# Patient Record
Sex: Female | Born: 1940 | Race: White | Hispanic: No | Smoking: Never smoker
Health system: Southern US, Community
[De-identification: ages and names within clinical notes are randomized; demographics above are authoritative.]

## PROBLEM LIST (undated history)

## (undated) DIAGNOSIS — E039 Hypothyroidism, unspecified: Secondary | ICD-10-CM

## (undated) DIAGNOSIS — T7840XA Allergy, unspecified, initial encounter: Secondary | ICD-10-CM

## (undated) DIAGNOSIS — N952 Postmenopausal atrophic vaginitis: Secondary | ICD-10-CM

## (undated) DIAGNOSIS — I10 Essential (primary) hypertension: Secondary | ICD-10-CM

## (undated) DIAGNOSIS — F329 Major depressive disorder, single episode, unspecified: Secondary | ICD-10-CM

## (undated) DIAGNOSIS — E079 Disorder of thyroid, unspecified: Secondary | ICD-10-CM

## (undated) DIAGNOSIS — M810 Age-related osteoporosis without current pathological fracture: Secondary | ICD-10-CM

## (undated) DIAGNOSIS — F32A Depression, unspecified: Secondary | ICD-10-CM

## (undated) DIAGNOSIS — M8580 Other specified disorders of bone density and structure, unspecified site: Secondary | ICD-10-CM

## (undated) HISTORY — DX: Other specified disorders of bone density and structure, unspecified site: M85.80

## (undated) HISTORY — DX: Disorder of thyroid, unspecified: E07.9

## (undated) HISTORY — PX: TONSILLECTOMY: SUR1361

## (undated) HISTORY — DX: Essential (primary) hypertension: I10

## (undated) HISTORY — DX: Allergy, unspecified, initial encounter: T78.40XA

## (undated) HISTORY — PX: FOOT SURGERY: SHX648

## (undated) HISTORY — DX: Major depressive disorder, single episode, unspecified: F32.9

## (undated) HISTORY — DX: Age-related osteoporosis without current pathological fracture: M81.0

## (undated) HISTORY — PX: COSMETIC SURGERY: SHX468

## (undated) HISTORY — DX: Postmenopausal atrophic vaginitis: N95.2

## (undated) HISTORY — PX: TUBAL LIGATION: SHX77

## (undated) HISTORY — DX: Depression, unspecified: F32.A

---

## 1998-01-11 ENCOUNTER — Other Ambulatory Visit: Admission: RE | Admit: 1998-01-11 | Discharge: 1998-01-11 | Payer: Self-pay | Admitting: Obstetrics and Gynecology

## 1999-04-06 ENCOUNTER — Other Ambulatory Visit: Admission: RE | Admit: 1999-04-06 | Discharge: 1999-04-06 | Payer: Self-pay | Admitting: Obstetrics and Gynecology

## 2000-06-05 ENCOUNTER — Other Ambulatory Visit: Admission: RE | Admit: 2000-06-05 | Discharge: 2000-06-05 | Payer: Self-pay | Admitting: Obstetrics and Gynecology

## 2001-06-13 ENCOUNTER — Other Ambulatory Visit: Admission: RE | Admit: 2001-06-13 | Discharge: 2001-06-13 | Payer: Self-pay | Admitting: Obstetrics and Gynecology

## 2002-07-23 ENCOUNTER — Other Ambulatory Visit: Admission: RE | Admit: 2002-07-23 | Discharge: 2002-07-23 | Payer: Self-pay | Admitting: Obstetrics and Gynecology

## 2003-08-25 ENCOUNTER — Other Ambulatory Visit: Admission: RE | Admit: 2003-08-25 | Discharge: 2003-08-25 | Payer: Self-pay | Admitting: Obstetrics and Gynecology

## 2004-11-21 ENCOUNTER — Other Ambulatory Visit: Admission: RE | Admit: 2004-11-21 | Discharge: 2004-11-21 | Payer: Self-pay | Admitting: Obstetrics and Gynecology

## 2005-08-27 ENCOUNTER — Emergency Department (HOSPITAL_COMMUNITY): Admission: EM | Admit: 2005-08-27 | Discharge: 2005-08-27 | Payer: Self-pay | Admitting: Emergency Medicine

## 2005-09-20 ENCOUNTER — Encounter: Admission: RE | Admit: 2005-09-20 | Discharge: 2005-11-06 | Payer: Self-pay | Admitting: Orthopedic Surgery

## 2005-11-07 ENCOUNTER — Encounter: Admission: RE | Admit: 2005-11-07 | Discharge: 2006-02-05 | Payer: Self-pay | Admitting: Orthopedic Surgery

## 2005-12-05 ENCOUNTER — Other Ambulatory Visit: Admission: RE | Admit: 2005-12-05 | Discharge: 2005-12-05 | Payer: Self-pay | Admitting: Obstetrics and Gynecology

## 2007-02-17 ENCOUNTER — Other Ambulatory Visit: Admission: RE | Admit: 2007-02-17 | Discharge: 2007-02-17 | Payer: Self-pay | Admitting: Obstetrics and Gynecology

## 2008-06-15 ENCOUNTER — Ambulatory Visit: Payer: Self-pay | Admitting: Obstetrics and Gynecology

## 2008-06-15 ENCOUNTER — Other Ambulatory Visit: Admission: RE | Admit: 2008-06-15 | Discharge: 2008-06-15 | Payer: Self-pay | Admitting: Obstetrics and Gynecology

## 2008-06-15 ENCOUNTER — Encounter: Payer: Self-pay | Admitting: Obstetrics and Gynecology

## 2009-08-17 ENCOUNTER — Ambulatory Visit: Payer: Self-pay | Admitting: Obstetrics and Gynecology

## 2010-11-16 ENCOUNTER — Encounter (INDEPENDENT_AMBULATORY_CARE_PROVIDER_SITE_OTHER): Payer: BC Managed Care – PPO | Admitting: Obstetrics and Gynecology

## 2010-11-16 ENCOUNTER — Other Ambulatory Visit (HOSPITAL_COMMUNITY)
Admission: RE | Admit: 2010-11-16 | Discharge: 2010-11-16 | Disposition: A | Discharge status: Home / Self Care | Payer: Medicare Other | Source: Ambulatory Visit | Attending: Obstetrics and Gynecology | Admitting: Obstetrics and Gynecology

## 2010-11-16 ENCOUNTER — Other Ambulatory Visit: Payer: Self-pay | Admitting: Obstetrics and Gynecology

## 2010-11-16 DIAGNOSIS — N951 Menopausal and female climacteric states: Secondary | ICD-10-CM

## 2010-11-16 DIAGNOSIS — N760 Acute vaginitis: Secondary | ICD-10-CM

## 2010-11-16 DIAGNOSIS — Z124 Encounter for screening for malignant neoplasm of cervix: Secondary | ICD-10-CM | POA: Insufficient documentation

## 2010-11-16 DIAGNOSIS — N952 Postmenopausal atrophic vaginitis: Secondary | ICD-10-CM

## 2010-11-16 DIAGNOSIS — M899 Disorder of bone, unspecified: Secondary | ICD-10-CM

## 2011-12-05 DIAGNOSIS — M8580 Other specified disorders of bone density and structure, unspecified site: Secondary | ICD-10-CM | POA: Insufficient documentation

## 2011-12-05 DIAGNOSIS — N952 Postmenopausal atrophic vaginitis: Secondary | ICD-10-CM | POA: Insufficient documentation

## 2011-12-05 DIAGNOSIS — I10 Essential (primary) hypertension: Secondary | ICD-10-CM | POA: Insufficient documentation

## 2011-12-13 ENCOUNTER — Encounter: Payer: Self-pay | Admitting: Obstetrics and Gynecology

## 2011-12-13 ENCOUNTER — Ambulatory Visit (INDEPENDENT_AMBULATORY_CARE_PROVIDER_SITE_OTHER): Payer: Medicare Other | Admitting: Obstetrics and Gynecology

## 2011-12-13 VITALS — BP 124/76 | Ht 65.0 in | Wt 157.0 lb

## 2011-12-13 DIAGNOSIS — E079 Disorder of thyroid, unspecified: Secondary | ICD-10-CM | POA: Insufficient documentation

## 2011-12-13 DIAGNOSIS — M8580 Other specified disorders of bone density and structure, unspecified site: Secondary | ICD-10-CM

## 2011-12-13 DIAGNOSIS — Z780 Asymptomatic menopausal state: Secondary | ICD-10-CM

## 2011-12-13 DIAGNOSIS — M899 Disorder of bone, unspecified: Secondary | ICD-10-CM

## 2011-12-13 DIAGNOSIS — M949 Disorder of cartilage, unspecified: Secondary | ICD-10-CM

## 2011-12-13 DIAGNOSIS — R350 Frequency of micturition: Secondary | ICD-10-CM

## 2011-12-13 DIAGNOSIS — N952 Postmenopausal atrophic vaginitis: Secondary | ICD-10-CM

## 2011-12-13 DIAGNOSIS — N951 Menopausal and female climacteric states: Secondary | ICD-10-CM

## 2011-12-13 MED ORDER — RALOXIFENE HCL 60 MG PO TABS: 60.0000 mg | ORAL_TABLET | Freq: Every day | ORAL | Status: AC

## 2011-12-13 NOTE — Patient Instructions (Signed)
Schedule mammogram.

## 2011-12-13 NOTE — Progress Notes (Signed)
Patient came to see me today for further followup. The first thing we discussed was her Evista. She is on it  for treatment of her osteopenia and breast cancer protection. She is having no problems with it. She is having some hot flashes but we both think that predated Evista and was due to hormonal issues. She is taking calcium and vitamin D. She has had no fractures. She is having no vaginal bleeding. She is having no pelvic pain. She did have an episode of diverticulitis this year but that has resolved. She is having urinary frequency without dysuria, urgency, hematuria, or incontinence. She continues to use a combination of estradiol/testosterone 0.2%-1% on the lips of her labia for vaginal dryness with excellent results. She uses it every third day. They also use a  Lubricant. She is going to schedule her yearly mammogram. Her last bone density showed improvement with the Evista.  ROS: 12 system review done. Pertinent positives above. Other positives are hypertension and hypothyroidism.  Physical examination: Kim gardner present HEENT within normal limits. Neck: Thyroid not large. No masses. Supraclavicular nodes: not enlarged. Breasts: Examined in both sitting midline position. No skin changes and no masses. Abdomen: Soft no guarding rebound or masses or hernia. Pelvic: External: Within normal limits. BUS: Within normal limits. Vaginal:within normal limits. Good estrogen effect. No evidence of cystocele rectocele or enterocele. Cervix: clean. Uterus: Normal size and shape. Adnexa: No masses. Rectovaginal exam: Confirmatory and negative. Extremities: Within normal limits.  Assessment: #1. Osteopenia #2. Menopausal symptoms #3. Atrophic vaginitis #4. Urinary frequency  Plan: Mammogram. Continue Evista. Continue estradiol/testosterone cream.

## 2011-12-14 LAB — URINALYSIS W MICROSCOPIC + REFLEX CULTURE
Bacteria, UA: NONE SEEN
Bilirubin Urine: NEGATIVE
Casts: NONE SEEN
Crystals: NONE SEEN
Glucose, UA: NEGATIVE mg/dL
Hgb urine dipstick: NEGATIVE
Ketones, ur: NEGATIVE mg/dL
Leukocytes, UA: NEGATIVE
Nitrite: NEGATIVE
Protein, ur: NEGATIVE mg/dL
Specific Gravity, Urine: 1.016 (ref 1.005–1.030)
Squamous Epithelial / HPF: NONE SEEN
Urobilinogen, UA: 0.2 mg/dL (ref 0.0–1.0)
pH: 6.5 (ref 5.0–8.0)

## 2012-01-24 ENCOUNTER — Ambulatory Visit (INDEPENDENT_AMBULATORY_CARE_PROVIDER_SITE_OTHER): Payer: Medicare Other | Admitting: Obstetrics and Gynecology

## 2012-01-24 DIAGNOSIS — N898 Other specified noninflammatory disorders of vagina: Secondary | ICD-10-CM

## 2012-01-24 DIAGNOSIS — B373 Candidiasis of vulva and vagina: Secondary | ICD-10-CM

## 2012-01-24 DIAGNOSIS — N899 Noninflammatory disorder of vagina, unspecified: Secondary | ICD-10-CM

## 2012-01-24 LAB — WET PREP FOR TRICH, YEAST, CLUE
Clue Cells Wet Prep HPF POC: NONE SEEN
Trich, Wet Prep: NONE SEEN

## 2012-01-24 MED ORDER — TERCONAZOLE 0.8 % VA CREA: 1.0000 | TOPICAL_CREAM | Freq: Every day | VAGINAL | Status: AC

## 2012-01-24 NOTE — Progress Notes (Signed)
A patient came to see me today with a one-week history of vulvar inflammation and redness. She is unaware of a discharge. She is not really having itching. She is having no vaginal odor.  Exam: Kennon Portela present. External: 3+ vulvitis. BUS: No lesions. Vaginal exam: Yeast like discharge. Wet prep positive for yeast.  Assessment: Yeast vaginitis  Plan: Terconazole 3 cream. Use externally and internally.

## 2012-01-30 ENCOUNTER — Telehealth: Payer: Self-pay | Admitting: *Deleted

## 2012-01-30 NOTE — Telephone Encounter (Signed)
Pt calling to follow up with ov regarding yeast infection 01/24/12, pt was give Terazol 3 day cream, pt said she is on 6 day and just starting to see improvement. Pt will refill Rx and call back if symptoms still there.

## 2012-02-21 ENCOUNTER — Telehealth: Payer: Self-pay | Admitting: *Deleted

## 2012-02-21 NOTE — Telephone Encounter (Signed)
Pt called requesting refills on her estradiol/testosterone 0.2%-1% to [facility], rx called in,spoke with stephanie, pt informed as well.

## 2012-02-26 ENCOUNTER — Encounter: Payer: Self-pay | Admitting: Obstetrics and Gynecology

## 2012-04-11 ENCOUNTER — Telehealth: Payer: Self-pay | Admitting: *Deleted

## 2012-04-11 MED ORDER — TERCONAZOLE 0.8 % VA CREA: 1.0000 | TOPICAL_CREAM | Freq: Every day | VAGINAL | Status: AC

## 2012-04-11 NOTE — Telephone Encounter (Signed)
Pt calling c/o yeast infection x 1 day vaginal irritation, pt has recurrent yeast Terazol 3 day cream sent pharmacy. Pt leaving town for 1 week, informed to make ov if no relief.

## 2012-04-15 ENCOUNTER — Encounter: Payer: Self-pay | Admitting: *Deleted

## 2012-04-15 NOTE — Progress Notes (Signed)
Patient ID: Crystal Lewis, female   DOB: [birth year], 71 y.o.   MRN: [id number] Pt called c/o yeast still there requesting more Terazol 3 day cream, pt was told Dr.G was out of office until July 31 st. Pt said he gave her 3 tube of medication, spoke with Sherrilyn Rist about this and was told to tell pt that she would need to make office visit at urgent care, pt is currently in [place]. Pt was okay with this.

## 2012-05-14 DIAGNOSIS — Z85828 Personal history of other malignant neoplasm of skin: Secondary | ICD-10-CM | POA: Insufficient documentation

## 2012-09-03 ENCOUNTER — Other Ambulatory Visit: Payer: Self-pay | Admitting: *Deleted

## 2012-09-03 NOTE — Telephone Encounter (Signed)
[facility] RF on estriol/testosterone aquaphor jar 0.2% - 1% cream refills til March 2014

## 2012-10-13 DIAGNOSIS — L821 Other seborrheic keratosis: Secondary | ICD-10-CM | POA: Insufficient documentation

## 2012-11-27 DIAGNOSIS — E039 Hypothyroidism, unspecified: Secondary | ICD-10-CM | POA: Insufficient documentation

## 2012-11-27 DIAGNOSIS — F32A Depression, unspecified: Secondary | ICD-10-CM | POA: Insufficient documentation

## 2012-11-27 DIAGNOSIS — I10 Essential (primary) hypertension: Secondary | ICD-10-CM | POA: Insufficient documentation

## 2013-02-10 DIAGNOSIS — N952 Postmenopausal atrophic vaginitis: Secondary | ICD-10-CM | POA: Insufficient documentation

## 2013-07-09 DIAGNOSIS — L219 Seborrheic dermatitis, unspecified: Secondary | ICD-10-CM | POA: Insufficient documentation

## 2013-07-09 DIAGNOSIS — L719 Rosacea, unspecified: Secondary | ICD-10-CM | POA: Insufficient documentation

## 2013-07-09 DIAGNOSIS — L814 Other melanin hyperpigmentation: Secondary | ICD-10-CM | POA: Insufficient documentation

## 2013-08-12 DIAGNOSIS — IMO0001 Reserved for inherently not codable concepts without codable children: Secondary | ICD-10-CM | POA: Insufficient documentation

## 2013-10-16 DIAGNOSIS — S22000A Wedge compression fracture of unspecified thoracic vertebra, initial encounter for closed fracture: Secondary | ICD-10-CM | POA: Insufficient documentation

## 2014-08-02 ENCOUNTER — Encounter: Payer: Self-pay | Admitting: Obstetrics and Gynecology

## 2014-12-17 DIAGNOSIS — D1801 Hemangioma of skin and subcutaneous tissue: Secondary | ICD-10-CM | POA: Insufficient documentation

## 2015-01-14 ENCOUNTER — Ambulatory Visit (INDEPENDENT_AMBULATORY_CARE_PROVIDER_SITE_OTHER): Payer: Medicare Other | Admitting: Emergency Medicine

## 2015-01-14 VITALS — BP 108/68 | HR 90 | Temp 98.6°F | Resp 17 | Ht 64.5 in | Wt 159.0 lb

## 2015-01-14 DIAGNOSIS — R69 Illness, unspecified: Principal | ICD-10-CM

## 2015-01-14 DIAGNOSIS — J111 Influenza due to unidentified influenza virus with other respiratory manifestations: Secondary | ICD-10-CM | POA: Diagnosis not present

## 2015-01-14 MED ORDER — HYDROCOD POLST-CPM POLST ER 10-8 MG/5ML PO SUER: 5.0000 mL | Freq: Two times a day (BID) | ORAL | Status: DC | PRN

## 2015-01-14 NOTE — Progress Notes (Signed)
[facility] [address] [place] [address] [phone]  Date:  01/14/2015   Name:  Crystal Lewis   DOB:  May 09, 1941   MRN:  [id number]  PCP:  Merry Lofty, MD    Chief Complaint: Cough; Dizziness; and Dehydration   History of Present Illness:  Crystal Lewis is a 74 y.o. very pleasant female patient who presents with the following:  Ill three days with fever and chills Has cough that is not productive. No wheezing or shortness of breath Daughter and grand child ill with similar No sore throat. No coryza No nausea or vomiting. No improvement with over the counter medications or other home remedies.  Denies other complaint or health concern today.    Patient Active Problem List   Diagnosis Date Noted  . Thyroid disease   . Hypertension   . Low bone mass   . Atrophic vaginitis     Past Medical History  Diagnosis Date  . Hypertension   . Low bone mass   . Atrophic vaginitis   . Thyroid disease     Hypothyroid  . Allergy   . Depression   . Osteoporosis     Past Surgical History  Procedure Laterality Date  . Foot surgery    . Tubal ligation    . Cosmetic surgery      History  Substance Use Topics  . Smoking status: Never Smoker   . Smokeless tobacco: Not on file  . Alcohol Use: Yes     Comment: rare    Family History  Problem Relation Age of Onset  . Breast cancer Mother     Age 7  . Hypertension Mother   . Cancer Mother   . Mental illness Mother   . Hypertension Son   . Breast cancer Cousin     Female 7st Mat cousin age 29  . Mental illness Maternal Grandmother   . Cancer Maternal Grandfather   . Heart disease Paternal Grandmother     Allergies  Allergen Reactions  . Sulfa Antibiotics     Medication list has been reviewed and updated.  Current Outpatient Prescriptions on File Prior to Visit  Medication Sig Dispense Refill  . B Complex Vitamins (VITAMIN B COMPLEX PO) Take by mouth.    Marland Kitchen buPROPion (WELLBUTRIN  SR) 150 MG 12 hr tablet Take 150 mg by mouth 2 (two) times daily.    . Calcium Carbonate-Vitamin D (CALCIUM-D PO) Take by mouth.    . cholecalciferol (VITAMIN D) 1000 UNITS tablet Take 1,000 Units by mouth daily.    . IODINE, KELP, PO Take by mouth.    . levothyroxine (SYNTHROID, LEVOTHROID) 100 MCG tablet Take 100 mcg by mouth daily.    . Liothyronine Sodium (CYTOMEL PO) Take by mouth.    . Multiple Vitamin (MULTIVITAMIN) capsule Take 1 capsule by mouth daily.    . NON FORMULARY Estriol/testosterone cream    . raloxifene (EVISTA) 60 MG tablet Take 1 tablet (60 mg total) by mouth daily. 90 tablet 3   No current facility-administered medications on file prior to visit.    Review of Systems:  As per HPI, otherwise negative.    Physical Examination: Filed Vitals:   01/14/15 0832  BP: 108/68  Pulse: 90  Temp: 98.6 F (37 C)  Resp: 17   Filed Vitals:   01/14/15 0832  Height: 5' 4.5" (1.638 m)  Weight: 159 lb (72.122 kg)   Body mass index is 26.88 kg/(m^2). Ideal  Body Weight: Weight in (lb) to have BMI = 25: 147.6  GEN: WDWN, NAD, Non-toxic, A & O x 3 HEENT: Atraumatic, Normocephalic. Neck supple. No masses, No LAD. Ears and Nose: No external deformity. CV: RRR, No M/G/R. No JVD. No thrill. No extra heart sounds. PULM: CTA B, no wheezes, crackles, rhonchi. No retractions. No resp. distress. No accessory muscle use. ABD: S, NT, ND, +BS. No rebound. No HSM. EXTR: No c/c/e NEURO Normal gait.  PSYCH: Normally interactive. Conversant. Not depressed or anxious appearing.  Calm demeanor.    Assessment and Plan: Influenza like illness Too late for tamiflu tussionex    Signed,  Ellison Carwin, MD

## 2015-01-14 NOTE — Patient Instructions (Signed)

## 2015-02-11 ENCOUNTER — Ambulatory Visit (INDEPENDENT_AMBULATORY_CARE_PROVIDER_SITE_OTHER): Payer: Medicare Other | Admitting: Family Medicine

## 2015-02-11 VITALS — BP 140/82 | HR 75 | Temp 98.7°F | Resp 16 | Ht 64.5 in | Wt 162.0 lb

## 2015-02-11 DIAGNOSIS — T161XXA Foreign body in right ear, initial encounter: Secondary | ICD-10-CM | POA: Diagnosis not present

## 2015-02-11 NOTE — Progress Notes (Signed)
[facility] [address], [place] [address] [phone]  Date:  02/11/2015   Name:  Crystal Lewis   DOB:  1940-10-06   MRN:  [id number]  PCP:  Merry Lofty, MD    Chief Complaint: Foreign Body in Ear   History of Present Illness:  Crystal Lewis is a 74 y.o. very pleasant female patient who presents with the following:  She has felt a FB in her right ear for a couple of weeks. Apparently she had a similar issue a few years ago and had a long hair in her ear.  Her doctor removed the hair and her sx resolved.   She does not have any pain or hearing loss, but notes a crackiling sound in her ear which reminds her exactly of what happened in the past- she feels certain that a hair is in her ear  Patient Active Problem List   Diagnosis Date Noted  . Thyroid disease   . Hypertension   . Low bone mass   . Atrophic vaginitis     Past Medical History  Diagnosis Date  . Hypertension   . Low bone mass   . Atrophic vaginitis   . Thyroid disease     Hypothyroid  . Allergy   . Depression   . Osteoporosis     Past Surgical History  Procedure Laterality Date  . Foot surgery    . Tubal ligation    . Cosmetic surgery      History  Substance Use Topics  . Smoking status: Never Smoker   . Smokeless tobacco: Not on file  . Alcohol Use: Yes     Comment: rare    Family History  Problem Relation Age of Onset  . Breast cancer Mother     Age 26  . Hypertension Mother   . Cancer Mother   . Mental illness Mother   . Hypertension Son   . Breast cancer Cousin     Female 60st Mat cousin age 78  . Mental illness Maternal Grandmother   . Cancer Maternal Grandfather   . Heart disease Paternal Grandmother     Allergies  Allergen Reactions  . Sulfa Antibiotics     Medication list has been reviewed and updated.  Current Outpatient Prescriptions on File Prior to Visit  Medication Sig Dispense Refill  . atenolol-chlorthalidone (TENORETIC) 50-25 MG per  tablet Take 1 tablet by mouth daily.    . B Complex Vitamins (VITAMIN B COMPLEX PO) Take by mouth.    Marland Kitchen buPROPion (WELLBUTRIN SR) 150 MG 12 hr tablet Take 150 mg by mouth 2 (two) times daily.    . Calcium Carbonate-Vitamin D (CALCIUM-D PO) Take by mouth.    . IODINE, KELP, PO Take by mouth.    . levothyroxine (SYNTHROID, LEVOTHROID) 100 MCG tablet Take 100 mcg by mouth daily.    . Liothyronine Sodium (CYTOMEL PO) Take by mouth.    . Multiple Vitamin (MULTIVITAMIN) capsule Take 1 capsule by mouth daily.    . NON FORMULARY Estriol/testosterone cream    . raloxifene (EVISTA) 60 MG tablet Take 1 tablet (60 mg total) by mouth daily. 90 tablet 3  . chlorpheniramine-HYDROcodone (TUSSIONEX PENNKINETIC ER) 10-8 MG/5ML SUER Take 5 mLs by mouth every 12 (twelve) hours as needed for cough. (Patient not taking: Reported on 02/11/2015) 60 mL 0   No current facility-administered medications on file prior to visit.    Review of Systems:  As per HPI- otherwise  negative.   Physical Examination: Filed Vitals:   02/11/15 1656  BP: 140/82  Pulse: 75  Temp: 98.7 F (37.1 C)  Resp: 16   Filed Vitals:   02/11/15 1656  Height: 5' 4.5" (1.638 m)  Weight: 162 lb (73.483 kg)   Body mass index is 27.39 kg/(m^2). Ideal Body Weight: Weight in (lb) to have BMI = 25: 147.6  GEN: WDWN, NAD, Non-toxic, A & O x 3 HEENT: Atraumatic, Normocephalic. Neck supple. No masses, No LAD.  TM and ear canals are WNL bilaterally.  Ears and Nose: No external deformity. CV: RRR, No M/G/R. No JVD. No thrill. No extra heart sounds. PULM: CTA B, no wheezes, crackles, rhonchi. No retractions. No resp. distress. No accessory muscle use. EXTR: No c/c/e NEURO Normal gait.  PSYCH: Normally interactive. Conversant. Not depressed or anxious appearing.  Calm demeanor.   Removed a hair from her right ear canal with forceps.  This resolved her sx Assessment and Plan: Foreign body in right ear, initial encounter  Removed hair as  above. She will follow-up if any questions  Signed Lamar Blinks, MD

## 2015-05-05 DIAGNOSIS — M79604 Pain in right leg: Secondary | ICD-10-CM | POA: Insufficient documentation

## 2015-05-05 DIAGNOSIS — L304 Erythema intertrigo: Secondary | ICD-10-CM | POA: Insufficient documentation

## 2015-05-05 DIAGNOSIS — R1031 Right lower quadrant pain: Secondary | ICD-10-CM | POA: Insufficient documentation

## 2015-09-17 ENCOUNTER — Ambulatory Visit (INDEPENDENT_AMBULATORY_CARE_PROVIDER_SITE_OTHER): Payer: Medicare Other | Admitting: Family Medicine

## 2015-09-17 VITALS — BP 122/72 | HR 104 | Temp 98.5°F | Resp 17 | Ht 64.5 in | Wt 150.0 lb

## 2015-09-17 DIAGNOSIS — R059 Cough, unspecified: Secondary | ICD-10-CM

## 2015-09-17 DIAGNOSIS — R05 Cough: Secondary | ICD-10-CM

## 2015-09-17 DIAGNOSIS — J069 Acute upper respiratory infection, unspecified: Secondary | ICD-10-CM

## 2015-09-17 MED ORDER — AZITHROMYCIN 250 MG PO TABS: ORAL_TABLET | ORAL | Status: DC

## 2015-09-17 NOTE — Patient Instructions (Signed)
Take the antihistamine that you have at home  Use the Tussionex 1 teaspoon every 12 hours as needed. Generally it is best used just for the bedtime dose.  Drink plenty of fluids and get enough rest  If you're not improving by Monday begin the azithromycin, sooner if you're getting significantly worse.  Return as needed

## 2015-09-17 NOTE — Progress Notes (Signed)
Patient ID: Crystal Lewis, female    DOB: 01-06-1941  Age: 74 y.o. MRN: [id number]  Chief Complaint  Patient presents with  . Nasal Congestion  . URI    Subjective:   Pleasant 75 year old lady who is here with an upper respiratory infection she caught from her grandkids a week ago. She had been having symptoms since about Monday. She does not smoke. She is not on a lot of regular medicines. She recently moved to a senior living facility. She is one of the youngest ones they're. She has not been running a fever. She has been having head congestion. She has sore throat and hoarseness. She has been coughing a lot at night. She had some yellow mucus yesterday and some greenish mucus after that. She felt like she should return the corner and she hasn't. She was seen by the nurse practitioner at the facility yesterday, who suggested she come in if not improving.  Current allergies, medications, problem list, past/family and social histories reviewed.  Objective:  BP 122/72 mmHg  Pulse 104  Temp(Src) 98.5 F (36.9 C) (Oral)  Resp 17  Ht 5' 4.5" (1.638 m)  Wt 150 lb (68.04 kg)  BMI 25.36 kg/m2  SpO2 98%  No major distress. Her nose is a little running. Her TMs are normal. Throat mild erythema without exudate. Neck supple without significant nodes. Chest is clear to auscultation. Heart regular without murmur. Skin is warm and dry. She did get the flu shot, senior version, earlier this fall.  Assessment & Plan:   Assessment: No diagnosis found.    Plan: Clinically a viral respiratory tract infection. I do not believe that that the symptoms are consistent with influenza or strep. Treat symptomatically. The patient already has a half half of a bottle of Tussionex, and I told her if that runs out I will call in some more for her. However did decide to give an antibiotic prescription for her to get filled if she is not turning the corner in the next couple of days.  No orders of the defined  types were placed in this encounter.    No orders of the defined types were placed in this encounter.         There are no Patient Instructions on file for this visit.   No Follow-up on file.   Crystal Kilty, MD 09/17/2015

## 2016-01-17 ENCOUNTER — Ambulatory Visit: Payer: Medicare Other | Attending: Physical Medicine and Rehabilitation

## 2016-01-17 DIAGNOSIS — R293 Abnormal posture: Secondary | ICD-10-CM | POA: Insufficient documentation

## 2016-01-17 DIAGNOSIS — M545 Low back pain, unspecified: Secondary | ICD-10-CM

## 2016-01-17 DIAGNOSIS — M546 Pain in thoracic spine: Secondary | ICD-10-CM | POA: Insufficient documentation

## 2016-01-17 DIAGNOSIS — M6281 Muscle weakness (generalized): Secondary | ICD-10-CM | POA: Diagnosis present

## 2016-01-17 NOTE — Therapy (Signed)
[facility] [address] [place], [place], [address] Phone: [phone]   Fax:  [phone]  Physical Therapy Evaluation  Patient Details  Name: Crystal Lewis MRN: [id number] Date of Birth: 05/25/1941 Referring Provider: Margorie John, MD  Encounter Date: 01/17/2016      PT End of Session - 01/17/16 1528    Visit Number 1   Number of Visits 10   Date for PT Re-Evaluation 03/13/16   PT Start Time 4827   PT Stop Time 1526   PT Time Calculation (min) 38 min   Activity Tolerance Patient tolerated treatment well   Behavior During Therapy [facility] for tasks assessed/performed      Past Medical History  Diagnosis Date  . Hypertension   . Low bone mass   . Atrophic vaginitis   . Thyroid disease     Hypothyroid  . Allergy   . Depression   . Osteoporosis     Past Surgical History  Procedure Laterality Date  . Foot surgery    . Tubal ligation    . Cosmetic surgery      There were no vitals filed for this visit.       Subjective Assessment - 01/17/16 1455    Subjective Pt presents to PT with complaints of chronic LBP.  Pt has history of osteporosis and thoracic compression fracture and degenerative scoliosis.     Pertinent History scoliosis, thoracic compression fracture (2 years ago)   Limitations Standing;Sitting   How long can you sit comfortably? if not sitting in the right chair- limited    How long can you stand comfortably? standing for housework: limited to 15-20 minutes   Diagnostic tests x-ray: degnerative scoliosis   Patient Stated Goals improve strength of spine, return to regular exercise, reduce pain   Currently in Pain? Yes   Pain Score 4   sometimes 0/10 during the day   Pain Location Back   Pain Orientation Right;Left;Lower;Mid   Pain Descriptors / Indicators Aching;Sore   Pain Type Chronic pain   Pain Onset More than a month ago   Pain Frequency Intermittent   Aggravating  Factors  night, standing for housework, sitting in the "wrong" chair   Pain Relieving Factors getting in the right position, stretching            [facility] PT Assessment - 01/17/16 0001    Assessment   Medical Diagnosis chronic back pain, scoliosis, t-spine compression fracture   Referring Provider Margorie John, MD   Onset Date/Surgical Date 01/16/14   Next MD Visit none   Prior Therapy none   Precautions   Precautions Fall;Other (comment)  osteporosis   Restrictions   Weight Bearing Restrictions No   Balance Screen   Has the patient fallen in the past 6 months No   Has the patient had a decrease in activity level because of a fear of falling?  No   Is the patient reluctant to leave their home because of a fear of falling?  No   Home Environment   Living Environment Assisted living   Living Arrangements Alone   Type of Home Assisted living   Additional Comments [facility]   Prior Function   Level of Independence Independent   Vocation Retired   Leisure Tai Chi at [facility]   Cognition   Overall Cognitive Status Within Functional Limits for tasks assessed   Observation/Other Assessments   Focus on Therapeutic Outcomes (FOTO)  45% limitation   Posture/Postural  Control   Posture/Postural Control Postural limitations   Postural Limitations Rounded Shoulders;Forward head;Increased thoracic kyphosis  thoracic scolisosi   ROM / Strength   AROM / PROM / Strength AROM;PROM;Strength   AROM   Overall AROM  Within functional limits for tasks performed   Overall AROM Comments normal AROM without pain today in spine and hips   PROM   Overall PROM  Within functional limits for tasks performed   Strength   Overall Strength Deficits   Overall Strength Comments Bil hip flexors 4/5, knees 4+/5   Palpation   Spinal mobility reduced spinal mobility in the thoracic and lumbar spine without pain today   Palpation comment muscle tension over Rt covex curve on Rt side of thoracic  spine   Ambulation/Gait   Ambulation/Gait Yes   Ambulation/Gait Assistance 7: Independent   Ambulation Distance (Feet) 100 Feet   Gait Pattern Within Functional Limits                           PT Education - 01/17/16 1526    Education provided Yes   Education Details HEP: scapular theraband attached with abdominal bracing, posture education   Person(s) Educated Patient   Methods Explanation;Demonstration   Comprehension Verbalized understanding;Returned demonstration          PT Short Term Goals - 01/17/16 1555    PT SHORT TERM GOAL #1   Title be independent in initial HEP   Time 4   Period Weeks   Status New   PT SHORT TERM GOAL #2   Title report a 30% reduction in thoracic pain with standing for washing dishes and doing housework   Time 4   Period Weeks   Status New   PT SHORT TERM GOAL #3   Title verbalize understanding of dos and don'ts of osteoporosis and body mechanics modifications needed to protect spine from fracture   Time 4   Period Weeks   Status New           PT Long Term Goals - 01/17/16 1458    PT LONG TERM GOAL #1   Title be independent in advanced HEP   Time 8   Period Weeks   Status New   PT LONG TERM GOAL #2   Title reduce FOTO to < or = to 38% limitation   Time 8   Period Weeks   Status New   PT LONG TERM GOAL #3   Title verbalize understanding of safe progresion of gym exercises to be performed at [facility]   Time 8   Period Weeks   Status New   PT LONG TERM GOAL #4   Title report a 60% reduction in thoracic and lumbar pain with standing for housework   Time 8   Period Weeks   Status New               Plan - 01/17/16 1507    Clinical Impression Statement Pt presents to PT with chronic history of LBP and 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  Pt has tried PT at other facilities but didn't feel that she was getting the education that she needed for strength and injury prevention.  Pt  demonstrates postural abnormality, weak hip flexors, weak postural stabilizers and reduced tolerance for standing activity due to weakness and pain in the thoracic and lumbar spine.  FOTO score is 45% limitation. Pt with poor mechanics and no education regarding her osteoporosis  precautions. Pt will benefit from skilled PT for postural education and strength, balance training and education on gym exercises that she can perform safely at [place].   Rehab Potential Good   PT Frequency 2x / week   PT Duration 8 weeks   PT Treatment/Interventions ADLs/Self Care Home Management;Cryotherapy;Electrical Stimulation;Moist Heat;Therapeutic exercise;Therapeutic activities;Ultrasound;Neuromuscular re-education;Manual techniques;Patient/family education;Passive range of motion   PT Next Visit Plan Osteoporosis eductation, strength for core and thoracic spine, posture education, gym exercises. Modalities PRN   Consulted and Agree with Plan of Care Patient      Patient will benefit from skilled therapeutic intervention in order to improve the following deficits and impairments:  Pain, Decreased strength, Improper body mechanics, Decreased activity tolerance, Decreased range of motion, Impaired flexibility  Visit Diagnosis: Bilateral low back pain without sciatica  Pain in thoracic spine  Abnormal posture  Muscle weakness (generalized)      G-Codes - 2016/01/26 1453    Functional Assessment Tool Used FOTO: 45% limitation   Functional Limitation Other PT primary   Other PT Primary Current Status (F1638) At least 40 percent but less than 60 percent impaired, limited or restricted   Other PT Primary Goal Status (G6659) At least 20 percent but less than 40 percent impaired, limited or restricted       Problem List Patient Active Problem List   Diagnosis Date Noted  . Thyroid disease   . Hypertension   . Low bone mass   . Atrophic vaginitis     Sigurd Sos, PT 01-26-2016 3:59 PM  [facility] [address], [place], [place], [address] Phone: [phone]   Fax:  [phone]  Name: Crystal Lewis MRN: [id number] Date of Birth: 02/28/1941

## 2016-01-17 NOTE — Patient Instructions (Signed)
KEEP HEAD IN NEUTRAL AND SHOULDERS DOWN AND RELAXED   Hold tubing in right hand, arm forward. Pull arm back, elbow straight. Repeat __10__ times per set. Do __2__ sets per session. Do _1-2___ sessions per day.  Copyright  VHI. All rights reserved.     With resistive band anchored in door, grasp both ends. Keeping elbows bent, pull back, squeezing shoulder blades together. Hold _3__ seconds. Repeat _2x10___ times. Do _1-2___ sessions per day.  [email or web address].[email or web address]   Posture - Standing   Good posture is important. Avoid slouching and forward head thrust. Maintain curve in low back and align ears over shoulders, hips over ankles.  Pull your belly button in toward your back bone. Posture Tips DO: - stand tall and erect - keep chin tucked in - keep head and shoulders in alignment - check posture regularly in mirror or large window - pull head back against headrest in car seat;  Change your position often.  Sit with lumbar support. DON'T: - slouch or slump while watching TV or reading - sit, stand or lie in one position  for too long;  Sitting is especially hard on the spine so if you sit at a desk/use the computer, then stand up often! Copyright  VHI. All rights reserved.  Posture - Sitting  Sit upright, head facing forward. Try using a roll to support lower back. Keep shoulders relaxed, and avoid rounded back. Keep hips level with knees. Avoid crossing legs for long periods. Copyright  VHI. All rights reserved.  Chronic neck strain can develop because of poor posture and faulty work habits  Postural strain related to slumped sitting and forward head posture is a leading cause of headaches, neck and upper back pain  General strengthening and flexibility exercises are helpful in the treatment of neck pain.  Most importantly, you should learn to correct the posture that may be contributing to chronic pain.   Change positions frequently  Change your work or home environment to improve  posture and mechanics.   [facility] [address] [place], [place] [address] Phone # [phone] Fax [phone]

## 2016-01-23 ENCOUNTER — Encounter: Payer: Medicare Other | Admitting: Physical Therapy

## 2016-01-25 ENCOUNTER — Ambulatory Visit: Payer: Medicare Other | Admitting: Physical Therapy

## 2016-01-25 ENCOUNTER — Encounter: Payer: Self-pay | Admitting: Physical Therapy

## 2016-01-25 DIAGNOSIS — M546 Pain in thoracic spine: Secondary | ICD-10-CM

## 2016-01-25 DIAGNOSIS — M6281 Muscle weakness (generalized): Secondary | ICD-10-CM

## 2016-01-25 DIAGNOSIS — M545 Low back pain, unspecified: Secondary | ICD-10-CM

## 2016-01-25 DIAGNOSIS — R293 Abnormal posture: Secondary | ICD-10-CM

## 2016-01-25 NOTE — Patient Instructions (Addendum)
Posture Tips DO: - stand tall and erect - keep chin tucked in - keep head and shoulders in alignment - check posture regularly in mirror or large window - pull head back against headrest in car seat;  Change your position often.  Sit with lumbar support. DON'T: - slouch or slump while watching TV or reading - sit, stand or lie in one position  for too long;  Sitting is especially hard on the spine so if you sit at a desk/use the computer, then stand up often!   Copyright  VHI. All rights reserved.  Posture - Standing   Good posture is important. Avoid slouching and forward head thrust. Maintain curve in low back and align ears over shoul- ders, hips over ankles.  Pull your belly button in toward your back bone.   Copyright  VHI. All rights reserved.  Posture - Sitting   Sit upright, head facing forward. Try using a roll to support lower back. Keep shoulders relaxed, and avoid rounded back. Keep hips level with knees. Avoid crossing legs for long periods.   Copyright  VHI. All rights reserved.  Sleeping on Back  Place pillow under knees. A pillow with cervical support and a roll around waist are also helpful. Copyright  VHI. All rights reserved.  Sleeping on Side Place pillow between knees. Use cervical support under neck and a roll around waist as needed. Copyright  VHI. All rights reserved.   Sleeping on Stomach   If this is the only desirable sleeping position, place pillow under lower legs, and under stomach or chest as needed.  Posture - Sitting   Sit upright, head facing forward. Try using a roll to support lower back. Keep shoulders relaxed, and avoid rounded back. Keep hips level with knees. Avoid crossing legs for long periods. Stand to Sit / Sit to Stand   To sit: Bend knees to lower self onto front edge of chair, then scoot back on seat. To stand: Reverse sequence by placing one foot forward, and scoot to front of seat. Use rocking motion to stand up.   Work  Height and Reach  Ideal work height is no more than 2 to 4 inches below elbow level when standing, and at elbow level when sitting. Reaching should be limited to arm's length, with elbows slightly bent.  Bending  Bend at hips and knees, not back. Keep feet shoulder-width apart.    Posture - Standing   Good posture is important. Avoid slouching and forward head thrust. Maintain curve in low back and align ears over shoul- ders, hips over ankles.  Alternating Positions   Alternate tasks and change positions frequently to reduce fatigue and muscle tension. Take rest breaks. Computer Work   Position work to Programmer, multimedia. Use proper work and seat height. Keep shoulders back and down, wrists straight, and elbows at right angles. Use chair that provides full back support. Add footrest and lumbar roll as needed.  Getting Into / Out of Car  Lower self onto seat, scoot back, then bring in one leg at a time. Reverse sequence to get out.  Dressing  Lie on back to pull socks or slacks over feet, or sit and bend leg while keeping back straight.    Housework - Sink  Place one foot on ledge of cabinet under sink when standing at sink for prolonged periods.   Pushing / Pulling  Pushing is preferable to pulling. Keep back in proper alignment, and use leg muscles to do the  work.  Energy manager and lift with both arms held against upper trunk. Tighten stomach muscles without holding breath. Use smooth movements to avoid jerking.  Avoid Twisting   Avoid twisting or bending back. Pivot around using foot movements, and bend at knees if needed when reaching for articles.  Carrying Luggage   Distribute weight evenly on both sides. Use a cart whenever possible. Do not twist trunk. Move body as a unit.   Lifting Principles .Maintain proper posture and head alignment. .Slide object as close as possible before lifting. .Move obstacles out of the way. .Test before lifting; ask for  help if too heavy. .Tighten stomach muscles without holding breath. .Use smooth movements; do not jerk. .Use legs to do the work, and pivot with feet. .Distribute the work load symmetrically and close to the center of trunk. .Push instead of pull whenever possible.   Ask For Help   Ask for help and delegate to others when possible. Coordinate your movements when lifting together, and maintain the low back curve.  Log Roll   Lying on back, bend left knee and place left arm across chest. Roll all in one movement to the right. Reverse to roll to the left. Always move as one unit. Housework - Sweeping  Use long-handled equipment to avoid stooping.   Housework - Wiping  Position yourself as close as possible to reach work surface. Avoid straining your back.  Laundry - Unloading Wash   To unload small items at bottom of washer, lift leg opposite to arm being used to reach.  [place] close to area to be raked. Use arm movements to do the work. Keep back straight and avoid twisting.     Cart  When reaching into cart with one arm, lift opposite leg to keep back straight.   Getting Into / Out of Bed  Lower self to lie down on one side by raising legs and lowering head at the same time. Use arms to assist moving without twisting. Bend both knees to roll onto back if desired. To sit up, start from lying on side, and use same move-ments in reverse. Housework - Vacuuming  Hold the vacuum with arm held at side. Step back and forth to move it, keeping head up. Avoid twisting.   Laundry - IT consultant so that bending and twisting can be avoided.   Laundry - Unloading Dryer  Squat down to reach into clothes dryer or use a reacher.  Gardening - Weeding / Probation officer or Kneel. Knee pads may be helpful.                                                             DO's and DON'T's   Avoid and/or Minimize positions of  forward bending ( flexion)  Side bending and rotation of the trunk  Especially when movements occur together   When your back aches:   Don't sit down   Lie down on your back with a small pillow under your head and one under your knees or as outlined by our therapist. Or, lie in the 90/90 position ( on the floor with your feet and legs on the sofa with knees and hips bent to 90 degrees)  Tying  or putting on your shoes:   Don't bend over to tie your shoes or put on socks.  Instead, bring one foot up, cross it over the opposite knee and bend forward (hinge) at the hips to so the task.  Keep your back straight.  If you cannot do this safely, then you need to use long handled assistive devices such as a shoehorn and sock puller.  Exercising:  Don't engage in ballistic types of exercise routines such as high-impact aerobics or jumping rope  Don't do exercises in the gym that bring you forward (abdominal crunches, sit-ups, touching your  toes, knee-to-chest, straight leg raising.)  Follow a regular exercise program that includes a variety of different weight-bearing activities, such as low-impact aerobics, T' ai chi or walking as your physical therapist advises  Do exercises that emphasize return to normal body alignment and strengthening of the muscles that keep your back straight, as outlined in this program or by your therapist  Household tasks:  Don't reach unnecessarily or twist your trunk when mopping, sweeping, vacuuming, raking, making beds, weeding gardens, getting objects ou of cupboards, etc.  Keep your broom, mop, vacuum, or rake close to you and mover your whole body as you move them. Walk over to the area on which you are working. Arrange kitchen, bathroom, and bedroom shelves so that frequently used items may be reached without excessive bending, twisting, and reaching.  Use a sturdy stool if necessary.  Don't bend from the waist to pick up something up  Off the floor, out  of the trunk of your car, or to brush your teeth, wash your face, etc.   Bend at the knees, keeping back straight as possible. Use a reacher if necessary.   Prevention of fracture is the so-called "BOTTOm -Line" in the management of OSTEOPOROSIS. Do not take unnecessary chances in movement. Once a compression fracture occurs, the process is very difficult to control; one fracture is frequently followed by many more.

## 2016-01-25 NOTE — Therapy (Signed)
[facility] [address] [place], [place], [address] Phone: [phone]   Fax:  [phone]  Physical Therapy Treatment  Patient Details  Name: DENZEL BACON MRN: [id number] Date of Birth: [birth year] Referring Provider: Margorie John, MD  Encounter Date: 01/25/2016      PT End of Session - 01/25/16 1139    Visit Number 2   Number of Visits 10   Date for PT Re-Evaluation 03/13/16   PT Start Time 1101   PT Stop Time 1142   PT Time Calculation (min) 41 min   Activity Tolerance Patient tolerated treatment well   Behavior During Therapy [facility] for tasks assessed/performed      Past Medical History  Diagnosis Date  . Hypertension   . Low bone mass   . Atrophic vaginitis   . Thyroid disease     Hypothyroid  . Allergy   . Depression   . Osteoporosis     Past Surgical History  Procedure Laterality Date  . Foot surgery    . Tubal ligation    . Cosmetic surgery      There were no vitals filed for this visit.      Subjective Assessment - 01/25/16 1106    Subjective The exercises really helped my shoulder.    Currently in Pain? Yes   Multiple Pain Sites No                         [facility] Adult PT Treatment/Exercise - 01/25/16 0001    Shoulder Exercises: Standing   Extension Strengthening;Both;20 reps;Theraband   Theraband Level (Shoulder Extension) Level 1 (Yellow)   Row Strengthening;Both;20 reps;Theraband   Theraband Level (Shoulder Row) Level 1 (Yellow)   Shoulder Exercises: ROM/Strengthening   UBE (Upper Arm Bike) L1 x 4 min reverse  Lumbar support                PT Education - 01/25/16 1115    Education provided Yes   Education Details Body mecahnics, posture, Osteo dos & don'ts   Person(s) Educated Patient   Methods Explanation;Demonstration;Tactile cues;Verbal cues;Handout   Comprehension Verbalized understanding;Returned demonstration          PT Short  Term Goals - 01/25/16 1136    PT SHORT TERM GOAL #1   Title be independent in initial HEP   Time 4   Period Weeks   Status Achieved   PT SHORT TERM GOAL #2   Title report a 30% reduction in thoracic pain with standing for washing dishes and doing housework   Time 4   Period Weeks   Status On-going   PT SHORT TERM GOAL #3   Title verbalize understanding of dos and don'ts of osteoporosis and body mechanics modifications needed to protect spine from fracture   Time 4   Period Weeks   Status Achieved           PT Long Term Goals - 01/17/16 1458    PT LONG TERM GOAL #1   Title be independent in advanced HEP   Time 8   Period Weeks   Status New   PT LONG TERM GOAL #2   Title reduce FOTO to < or = to 38% limitation   Time 8   Period Weeks   Status New   PT LONG TERM GOAL #3   Title verbalize understanding of safe progresion of gym exercises to be performed at [facility]   Time 8  Period Weeks   Status New   PT LONG TERM GOAL #4   Title report a 60% reduction in thoracic and lumbar pain with standing for housework   Time 8   Period Weeks   Status New               Plan - 01/25/16 1134    Clinical Impression Statement Educated pt in lumbar and Osteoporosis protective body menchanics today. Pain not a factor today.   Rehab Potential Good   PT Frequency 2x / week   PT Duration 8 weeks   PT Treatment/Interventions ADLs/Self Care Home Management;Cryotherapy;Electrical Stimulation;Moist Heat;Therapeutic exercise;Therapeutic activities;Ultrasound;Neuromuscular re-education;Manual techniques;Patient/family education;Passive range of motion   PT Next Visit Plan Core strength follow up with any questions regarding body mechincs   Consulted and Agree with Plan of Care Patient      Patient will benefit from skilled therapeutic intervention in order to improve the following deficits and impairments:  Pain, Decreased strength, Improper body mechanics, Decreased activity  tolerance, Decreased range of motion, Impaired flexibility  Visit Diagnosis: Bilateral low back pain without sciatica  Muscle weakness (generalized)  Abnormal posture  Pain in thoracic spine     Problem List Patient Active Problem List   Diagnosis Date Noted  . Thyroid disease   . Hypertension   . Low bone mass   . Atrophic vaginitis     Latoi Giraldo, PTA 01/25/2016, 11:43 AM  [facility] [address], [place], [place], [address] Phone: [phone]   Fax:  [phone]  Name: SITLALI FAZEL MRN: [id number] Date of Birth: Feb 27, 1941

## 2016-02-01 ENCOUNTER — Encounter: Payer: Medicare Other | Admitting: Physical Therapy

## 2016-02-03 ENCOUNTER — Encounter: Payer: Self-pay | Admitting: Physical Therapy

## 2016-02-03 ENCOUNTER — Ambulatory Visit: Payer: Medicare Other | Attending: Physical Medicine and Rehabilitation | Admitting: Physical Therapy

## 2016-02-03 DIAGNOSIS — M545 Low back pain, unspecified: Secondary | ICD-10-CM

## 2016-02-03 DIAGNOSIS — M546 Pain in thoracic spine: Secondary | ICD-10-CM | POA: Diagnosis present

## 2016-02-03 DIAGNOSIS — M6281 Muscle weakness (generalized): Secondary | ICD-10-CM | POA: Insufficient documentation

## 2016-02-03 DIAGNOSIS — R293 Abnormal posture: Secondary | ICD-10-CM | POA: Diagnosis present

## 2016-02-03 NOTE — Therapy (Signed)
[facility] [address] [place], [place], [address] Phone: [phone]   Fax:  [phone]  Physical Therapy Treatment  Patient Details  Name: Crystal Lewis MRN: [id number] Date of Birth: 1941-08-26 Referring Provider: Margorie John, MD  Encounter Date: 02/03/2016      PT End of Session - 02/03/16 1112    Visit Number 3   Number of Visits 10   Date for PT Re-Evaluation 03/13/16   PT Start Time 0923   PT Stop Time 1143   PT Time Calculation (min) 38 min   Activity Tolerance Patient tolerated treatment well   Behavior During Therapy [facility] for tasks assessed/performed      Past Medical History  Diagnosis Date  . Hypertension   . Low bone mass   . Atrophic vaginitis   . Thyroid disease     Hypothyroid  . Allergy   . Depression   . Osteoporosis     Past Surgical History  Procedure Laterality Date  . Foot surgery    . Tubal ligation    . Cosmetic surgery      There were no vitals filed for this visit.      Subjective Assessment - 02/03/16 1113    Subjective I've been doing Tai Chi and I think it is great.   Currently in Pain? No/denies   Multiple Pain Sites No                         [facility] Adult PT Treatment/Exercise - 02/03/16 0001    Shoulder Exercises: Supine   Other Supine Exercises Shoulder press and leg lengthener for HEP   Shoulder Exercises: Seated   Horizontal ABduction AROM;Strengthening;Both;10 reps;Theraband   Theraband Level (Shoulder Horizontal ABduction) Level 1 (Yellow)   Shoulder Exercises: Standing   Extension Strengthening;Both;20 reps;Theraband   Theraband Level (Shoulder Extension) Level 1 (Yellow)   Row Strengthening;Both;20 reps;Theraband   Theraband Level (Shoulder Row) Level 1 (Yellow)   Shoulder Exercises: ROM/Strengthening   UBE (Upper Arm Bike) L1 x 5 min reverse lumbar roll                 PT Education - 02/03/16 1132    Education provided Yes   Education Details HEP: Shoulder press & leg lengthener   Person(s) Educated Patient   Methods Explanation;Demonstration;Tactile cues;Verbal cues;Handout   Comprehension Verbalized understanding;Returned demonstration          PT Short Term Goals - 02/03/16 1118    PT SHORT TERM GOAL #2   Title report a 30% reduction in thoracic pain with standing for washing dishes and doing housework   Time 4   Period Weeks   Status On-going  Will pay attention and give me an answer then           PT Long Term Goals - 01/17/16 1458    PT LONG TERM GOAL #1   Title be independent in advanced HEP   Time 8   Period Weeks   Status New   PT LONG TERM GOAL #2   Title reduce FOTO to < or = to 38% limitation   Time 8   Period Weeks   Status New   PT LONG TERM GOAL #3   Title verbalize understanding of safe progresion of gym exercises to be performed at [facility]   Time 8   Period Weeks   Status New   PT LONG TERM GOAL #4  Title report a 60% reduction in thoracic and lumbar pain with standing for housework   Time 8   Period Weeks   Status New               Plan - 02/03/16 1113    Clinical Impression Statement Pt tends to stand with her pelvis tucked, hanging on her anterior hip ligaments. She needs to be physically corrected. She is independent in her HEP and has added Tai Chi to her routine. She thinks that she is feeling better doing housework, she says she didn't really pay attention.    Rehab Potential Good   PT Duration 8 weeks   PT Treatment/Interventions ADLs/Self Care Home Management;Cryotherapy;Electrical Stimulation;Moist Heat;Therapeutic exercise;Therapeutic activities;Ultrasound;Neuromuscular re-education;Manual techniques;Patient/family education;Passive range of motion      Patient will benefit from skilled therapeutic intervention in order to improve the following deficits and impairments:  Pain, Decreased strength, Improper body  mechanics, Decreased activity tolerance, Decreased range of motion, Impaired flexibility  Visit Diagnosis: Bilateral low back pain without sciatica  Muscle weakness (generalized)  Abnormal posture  Pain in thoracic spine     Problem List Patient Active Problem List   Diagnosis Date Noted  . Thyroid disease   . Hypertension   . Low bone mass   . Atrophic vaginitis     Crystal Lewis, PTA 02/03/2016, 11:40 AM  [facility] [address], [place], [place], [address] Phone: [phone]   Fax:  [phone]  Name: Crystal Lewis MRN: [id number] Date of Birth: 09/04/1941

## 2016-02-03 NOTE — Patient Instructions (Signed)
RE-ALIGNMENT ROUTINE EXERCISES-OSTEOPROROSIS BASIC FOR POSTURAL CORRECTION   RE-ALIGNMENT Tips BENEFITS: 1.It helps to re-align the curves of the back and improve standing posture. 2.It allows the back muscles to rest and strengthen in preparation for more activity. FREQUENCY: Daily, even after weeks, months and years of more advanced exercises. START: 1.All exercises start in the same position: lying on the back, arms resting on the supporting surface, palms up and slightly away from the body, backs of hands down, knees bent, feet flat. 2.The head, neck, arms, and legs are supported according to specific instructions of your therapist. Copyright  VHI. All rights reserved.    1. Decompression Exercise: Basic.   Takes compression off the vertebral bodies; increases tolerance for lying on the back; helps relieve back pain   Lie on back on firm surface, knees bent, feet flat, arms turned up, out to sides (~35 degrees). Head neck and arms supported as necessary. Time _5-15__ minutes. Surface: floor     2. Shoulder Press  Strengthens upper back extensors and scapular retractors.   Press both shoulders down. Hold _2-3__ seconds. Repeat _3-5__ times. Surface: floor         4. Leg Lengthener: stretches quadratus lumborum and hip flexors.  Strengthens quads and ankle dorsiflexors. Straighten out one leg, other leg bent. Inhale, exhale and pull the toes back reaching the heel away from your body. Hold 3 seconds. Do 5x. Repeat on the other leg.

## 2016-02-08 ENCOUNTER — Ambulatory Visit: Payer: Medicare Other | Admitting: Physical Therapy

## 2016-02-08 ENCOUNTER — Encounter: Payer: Self-pay | Admitting: Physical Therapy

## 2016-02-08 DIAGNOSIS — M545 Low back pain, unspecified: Secondary | ICD-10-CM

## 2016-02-08 DIAGNOSIS — R293 Abnormal posture: Secondary | ICD-10-CM

## 2016-02-08 DIAGNOSIS — M546 Pain in thoracic spine: Secondary | ICD-10-CM

## 2016-02-08 DIAGNOSIS — M6281 Muscle weakness (generalized): Secondary | ICD-10-CM

## 2016-02-08 NOTE — Therapy (Signed)
[facility] [address] [place], [place], [address] Phone: [phone]   Fax:  [phone]  Physical Therapy Treatment  Patient Details  Name: Crystal Lewis MRN: [id number] Date of Birth: 02-08-1941 Referring Provider: Margorie John, MD  Encounter Date: 02/08/2016      PT End of Session - 02/08/16 1105    Visit Number 4   Number of Visits 10   Date for PT Re-Evaluation 03/13/16   PT Start Time 1102   PT Stop Time 1141   PT Time Calculation (min) 39 min   Activity Tolerance Patient tolerated treatment well   Behavior During Therapy [facility] for tasks assessed/performed      Past Medical History  Diagnosis Date  . Hypertension   . Low bone mass   . Atrophic vaginitis   . Thyroid disease     Hypothyroid  . Allergy   . Depression   . Osteoporosis     Past Surgical History  Procedure Laterality Date  . Foot surgery    . Tubal ligation    . Cosmetic surgery      There were no vitals filed for this visit.      Subjective Assessment - 02/08/16 1105    Subjective No new complaints. Was able to walk [facility] yesterday.   Currently in Pain? Yes   Pain Score 3    Pain Location Shoulder   Pain Orientation Right   Pain Descriptors / Indicators Sore   Aggravating Factors  Overdoing it   Pain Relieving Factors Getting in right position, stretching   Multiple Pain Sites No                         [facility] Adult PT Treatment/Exercise - 02/08/16 0001    Shoulder Exercises: Seated   Horizontal ABduction Strengthening;Both;20 reps;Theraband   Theraband Level (Shoulder Horizontal ABduction) Level 2 (Red)   Horizontal ABduction Weight (lbs) --  Diagonols bil 2 x10 red   Shoulder Exercises: ROM/Strengthening   UBE (Upper Arm Bike) L1 x 6 min reverse lumbar roll    Wall Pushups 10 reps   Shoulder Exercises: Stretch   Other Shoulder Stretches Lifting the heart for thoracic extension  2x5                   PT Short Term Goals - 02/08/16 1107    PT SHORT TERM GOAL #2   Title report a 30% reduction in thoracic pain with standing for washing dishes and doing housework   Time 4   Period Weeks   Status Achieved  30%           PT Long Term Goals - 01/17/16 1458    PT LONG TERM GOAL #1   Title be independent in advanced HEP   Time 8   Period Weeks   Status New   PT LONG TERM GOAL #2   Title reduce FOTO to < or = to 38% limitation   Time 8   Period Weeks   Status New   PT LONG TERM GOAL #3   Title verbalize understanding of safe progresion of gym exercises to be performed at [facility]   Time 8   Period Weeks   Status New   PT LONG TERM GOAL #4   Title report a 60% reduction in thoracic and lumbar pain with standing for housework   Time 8   Period Weeks   Status New  Plan - 02/08/16 1128    Clinical Impression Statement Pt reports today in general doing her standing housework is 30% easier/less painful. She reports leaning over the sink to dye her hair is probably most painful. Added thoracic extension stretch seated toay to HEP and pt was bale to perform  wall pushups today with excellent form.  All STGs met. as of today.    Rehab Potential Good   PT Frequency 2x / week   PT Treatment/Interventions ADLs/Self Care Home Management;Cryotherapy;Electrical Stimulation;Moist Heat;Therapeutic exercise;Therapeutic activities;Ultrasound;Neuromuscular re-education;Manual techniques;Patient/family education;Passive range of motion   PT Next Visit Plan try lifting arms off the wall or try in prone?   Consulted and Agree with Plan of Care Patient      Patient will benefit from skilled therapeutic intervention in order to improve the following deficits and impairments:  Pain, Decreased strength, Improper body mechanics, Decreased activity tolerance, Decreased range of motion, Impaired flexibility  Visit Diagnosis: Bilateral low back  pain without sciatica  Muscle weakness (generalized)  Abnormal posture  Pain in thoracic spine     Problem List Patient Active Problem List   Diagnosis Date Noted  . Thyroid disease   . Hypertension   . Low bone mass   . Atrophic vaginitis     Lasonya Hubner, PTA 02/08/2016, 11:44 AM  [facility] [address], [place], [place], [address] Phone: [phone]   Fax:  [phone]  Name: Crystal Lewis MRN: [id number] Date of Birth: [birth year]

## 2016-02-10 ENCOUNTER — Ambulatory Visit: Payer: Medicare Other | Admitting: Physical Therapy

## 2016-02-10 ENCOUNTER — Encounter: Payer: Self-pay | Admitting: Physical Therapy

## 2016-02-10 DIAGNOSIS — M545 Low back pain, unspecified: Secondary | ICD-10-CM

## 2016-02-10 DIAGNOSIS — M546 Pain in thoracic spine: Secondary | ICD-10-CM

## 2016-02-10 DIAGNOSIS — M6281 Muscle weakness (generalized): Secondary | ICD-10-CM

## 2016-02-10 DIAGNOSIS — R293 Abnormal posture: Secondary | ICD-10-CM

## 2016-02-10 NOTE — Therapy (Signed)
[facility] [address], [place], [place], [address] Phone: [phone]   Fax:  [phone]  Physical Therapy Treatment  Patient Details  Name: Crystal Lewis MRN: [id number] Date of Birth: May 07, 1941 Referring Provider: Margorie John, MD  Encounter Date: 02/10/2016      PT End of Session - 02/10/16 0941    Visit Number 5   Number of Visits 10   Date for PT Re-Evaluation 03/13/16   PT Start Time 0937   PT Stop Time 1012   PT Time Calculation (min) 35 min   Activity Tolerance Patient tolerated treatment well   Behavior During Therapy [facility] for tasks assessed/performed      Past Medical History  Diagnosis Date  . Hypertension   . Low bone mass   . Atrophic vaginitis   . Thyroid disease     Hypothyroid  . Allergy   . Depression   . Osteoporosis     Past Surgical History  Procedure Laterality Date  . Foot surgery    . Tubal ligation    . Cosmetic surgery      There were no vitals filed for this visit.      Subjective Assessment - 02/10/16 0942    Subjective Pt reports tolerating last tx just fine, went to [facility] yesterday.   Currently in Pain? No/denies   Multiple Pain Sites No                         [facility] Adult PT Treatment/Exercise - 02/10/16 0001    Shoulder Exercises: Seated   Horizontal ABduction Strengthening;Both;Theraband  3x 10   Theraband Level (Shoulder Horizontal ABduction) Level 2 (Red)   Horizontal ABduction Weight (lbs) --  Diagonols bil 2 x10 red   Shoulder Exercises: ROM/Strengthening   UBE (Upper Arm Bike) L1 x 6 min reverse lumbar roll    Wall Pushups 20 reps   Shoulder Exercises: Stretch   Other Shoulder Stretches Lifting the heart for thoracic extension 2x5   Added lifting the arms overhead into a V                  PT Short Term Goals - 02/08/16 1107    PT SHORT TERM GOAL #2   Title report a 30% reduction in thoracic pain with  standing for washing dishes and doing housework   Time 4   Period Weeks   Status Achieved  30%           PT Long Term Goals - 01/17/16 1458    PT LONG TERM GOAL #1   Title be independent in advanced HEP   Time 8   Period Weeks   Status New   PT LONG TERM GOAL #2   Title reduce FOTO to < or = to 38% limitation   Time 8   Period Weeks   Status New   PT LONG TERM GOAL #3   Title verbalize understanding of safe progresion of gym exercises to be performed at [facility]   Time 8   Period Weeks   Status New   PT LONG TERM GOAL #4   Title report a 60% reduction in thoracic and lumbar pain with standing for housework   Time 8   Period Weeks   Status New               Plan - 02/10/16 0941    Clinical Impression Statement Continues to tolerate  all her exercises well, no pain. She is doing even better with all her housework.    Rehab Potential Good   PT Frequency 2x / week   PT Duration 8 weeks   PT Treatment/Interventions ADLs/Self Care Home Management;Cryotherapy;Electrical Stimulation;Moist Heat;Therapeutic exercise;Therapeutic activities;Ultrasound;Neuromuscular re-education;Manual techniques;Patient/family education;Passive range of motion   PT Next Visit Plan Thoracic extension ROm & strength   Consulted and Agree with Plan of Care Patient      Patient will benefit from skilled therapeutic intervention in order to improve the following deficits and impairments:  Pain, Decreased strength, Improper body mechanics, Decreased activity tolerance, Decreased range of motion, Impaired flexibility  Visit Diagnosis: Bilateral low back pain without sciatica  Muscle weakness (generalized)  Abnormal posture  Pain in thoracic spine     Problem List Patient Active Problem List   Diagnosis Date Noted  . Thyroid disease   . Hypertension   . Low bone mass   . Atrophic vaginitis     Taylar Hartsough, PTA 02/10/2016, 10:15 AM  [facility] [address], [place], [place], [address] Phone: [phone]   Fax:  [phone]  Name: Crystal Lewis MRN: [id number] Date of Birth: 07-24-1941

## 2016-02-15 ENCOUNTER — Encounter: Payer: Self-pay | Admitting: Physical Therapy

## 2016-02-15 ENCOUNTER — Ambulatory Visit: Payer: Medicare Other | Admitting: Physical Therapy

## 2016-02-15 DIAGNOSIS — M546 Pain in thoracic spine: Secondary | ICD-10-CM

## 2016-02-15 DIAGNOSIS — R293 Abnormal posture: Secondary | ICD-10-CM

## 2016-02-15 DIAGNOSIS — M6281 Muscle weakness (generalized): Secondary | ICD-10-CM

## 2016-02-15 DIAGNOSIS — M545 Low back pain, unspecified: Secondary | ICD-10-CM

## 2016-02-15 NOTE — Therapy (Signed)
[facility] [address], [place], [place], [address] Phone: [phone]   Fax:  [phone]  Physical Therapy Treatment  Patient Details  Name: Crystal Lewis MRN: [id number] Date of Birth: 28-Jan-1941 Referring Provider: Margorie John, MD  Encounter Date: 02/15/2016      PT End of Session - 02/15/16 1107    Visit Number 6   Number of Visits 10   Date for PT Re-Evaluation 03/13/16   PT Start Time 1100   PT Stop Time 1150   PT Time Calculation (min) 50 min   Activity Tolerance Patient tolerated treatment well   Behavior During Therapy [facility] for tasks assessed/performed      Past Medical History  Diagnosis Date  . Hypertension   . Low bone mass   . Atrophic vaginitis   . Thyroid disease     Hypothyroid  . Allergy   . Depression   . Osteoporosis     Past Surgical History  Procedure Laterality Date  . Foot surgery    . Tubal ligation    . Cosmetic surgery      There were no vitals filed for this visit.      Subjective Assessment - 02/15/16 1104    Subjective She has babysat  her twin grandsons and had home emergency which is still goin gon. Pt feels this stress has made her back hurt and sore.    Pain Score 5    Pain Location Back   Pain Orientation Mid;Lower   Pain Descriptors / Indicators Sore   Aggravating Factors  Stress   Pain Relieving Factors Exercise   Multiple Pain Sites No                         [facility] Adult PT Treatment/Exercise - 02/15/16 0001    Shoulder Exercises: Seated   Horizontal ABduction Strengthening;Both;Theraband  3x 10   Theraband Level (Shoulder Horizontal ABduction) Level 1 (Yellow)   Other Seated Exercises Lift the heart 10x    Other Seated Exercises Lift the arms overhead with thoracic extension 10x   Shoulder Exercises: ROM/Strengthening   UBE (Upper Arm Bike) L1 x 6 min reverse lumbar roll    Moist Heat Therapy   Number Minutes Moist Heat  20 Minutes   Moist Heat Location --  Back   Electrical Stimulation   Electrical Stimulation Location Thoracic   Electrical Stimulation Action IFC   Electrical Stimulation Goals Pain                  PT Short Term Goals - 02/08/16 1107    PT SHORT TERM GOAL #2   Title report a 30% reduction in thoracic pain with standing for washing dishes and doing housework   Time 4   Period Weeks   Status Achieved  30%           PT Long Term Goals - 01/17/16 1458    PT LONG TERM GOAL #1   Title be independent in advanced HEP   Time 8   Period Weeks   Status New   PT LONG TERM GOAL #2   Title reduce FOTO to < or = to 38% limitation   Time 8   Period Weeks   Status New   PT LONG TERM GOAL #3   Title verbalize understanding of safe progresion of gym exercises to be performed at [facility]   Time 8   Period Weeks  Status New   PT LONG TERM GOAL #4   Title report a 60% reduction in thoracic and lumbar pain with standing for housework   Time 8   Period Weeks   Status New               Plan - 02/15/16 1107    Clinical Impression Statement Pt has had rough week with just keeping her grandkids and having to travel  to [place] a few times. She was able to tolerate all her exercises but no more. Tried some Estim for her pain today.    Rehab Potential Good   PT Frequency 2x / week   PT Duration 8 weeks   PT Treatment/Interventions ADLs/Self Care Home Management;Cryotherapy;Electrical Stimulation;Moist Heat;Therapeutic exercise;Therapeutic activities;Ultrasound;Neuromuscular re-education;Manual techniques;Patient/family education;Passive range of motion   PT Next Visit Plan Thoracic extension ROm & strength   Consulted and Agree with Plan of Care Patient      Patient will benefit from skilled therapeutic intervention in order to improve the following deficits and impairments:  Pain, Decreased strength, Improper body mechanics, Decreased activity tolerance, Decreased  range of motion, Impaired flexibility  Visit Diagnosis: Bilateral low back pain without sciatica  Muscle weakness (generalized)  Abnormal posture  Pain in thoracic spine     Problem List Patient Active Problem List   Diagnosis Date Noted  . Thyroid disease   . Hypertension   . Low bone mass   . Atrophic vaginitis     COCHRAN,JENNIFER, PTA 02/15/2016, 11:37 AM  [facility] [address] [place], [place], [address] Phone: [phone]   Fax:  [phone]  Name: Crystal Lewis MRN: [id number] Date of Birth: 23-Jul-1941

## 2016-02-17 ENCOUNTER — Encounter: Payer: Medicare Other | Admitting: Physical Therapy

## 2016-02-24 ENCOUNTER — Encounter: Payer: Medicare Other | Admitting: Physical Therapy

## 2016-02-29 ENCOUNTER — Ambulatory Visit: Payer: Medicare Other | Admitting: Physical Therapy

## 2016-02-29 ENCOUNTER — Encounter: Payer: Self-pay | Admitting: Physical Therapy

## 2016-02-29 DIAGNOSIS — M545 Low back pain, unspecified: Secondary | ICD-10-CM

## 2016-02-29 DIAGNOSIS — M546 Pain in thoracic spine: Secondary | ICD-10-CM

## 2016-02-29 DIAGNOSIS — R293 Abnormal posture: Secondary | ICD-10-CM

## 2016-02-29 DIAGNOSIS — M6281 Muscle weakness (generalized): Secondary | ICD-10-CM

## 2016-02-29 NOTE — Therapy (Signed)
[facility] [address] [place], [place], [address] Phone: [phone]   Fax:  [phone]  Physical Therapy Treatment  Patient Details  Name: Crystal Lewis MRN: [id number] Date of Birth: 17-Jul-1941 Referring Provider: Margorie John, MD  Encounter Date: 02/29/2016      PT End of Session - 02/29/16 1101    Visit Number 7   Number of Visits 10   Date for PT Re-Evaluation 03/13/16   PT Start Time 7096   PT Stop Time 1140   PT Time Calculation (min) 43 min   Activity Tolerance Patient tolerated treatment well   Behavior During Therapy [facility] for tasks assessed/performed      Past Medical History  Diagnosis Date  . Hypertension   . Low bone mass   . Atrophic vaginitis   . Thyroid disease     Hypothyroid  . Allergy   . Depression   . Osteoporosis     Past Surgical History  Procedure Laterality Date  . Foot surgery    . Tubal ligation    . Cosmetic surgery      There were no vitals filed for this visit.                       [place] Adult PT Treatment/Exercise - 02/29/16 0001    Shoulder Exercises: Supine   Other Supine Exercises --  Supine TA contraction 10x added to HEP   Shoulder Exercises: Seated   Horizontal ABduction Strengthening;Both;20 reps;Theraband   Theraband Level (Shoulder Horizontal ABduction) Level 3 (Green)  Added green band for home advancement   Horizontal ABduction Weight (lbs) --  Diagonols 2x 10 bil green band   Other Seated Exercises Lift the arms overhead with thoracic extension 10x   Shoulder Exercises: Pulleys   Flexion 3 minutes   Shoulder Exercises: ROM/Strengthening   UBE (Upper Arm Bike) L1 x 6 min reverse lumbar roll    Shoulder Exercises: Stretch   Other Shoulder Stretches Leg lengthener bil 3x 10 sec each                PT Education - 02/29/16 1136    Education provided Yes   Education Details HEP TA contraction   Person(s)  Educated Patient   Methods Explanation;Demonstration;Tactile cues;Verbal cues   Comprehension Verbalized understanding;Returned demonstration          PT Short Term Goals - 02/08/16 1107    PT SHORT TERM GOAL #2   Title report a 30% reduction in thoracic pain with standing for washing dishes and doing housework   Time 4   Period Weeks   Status Achieved  30%           PT Long Term Goals - 02/29/16 1110    PT LONG TERM GOAL #4   Title report a 60% reduction in thoracic and lumbar pain with standing for housework   Time 8   Period Weeks   Status Partially Met  50%               Plan - 02/29/16 1102    Clinical Impression Statement Pt reports she feels 50% less pain with her ADLs since starting therapy.    Rehab Potential Good   PT Frequency 2x / week   PT Duration 8 weeks   PT Treatment/Interventions ADLs/Self Care Home Management;Cryotherapy;Electrical Stimulation;Moist Heat;Therapeutic exercise;Therapeutic activities;Ultrasound;Neuromuscular re-education;Manual techniques;Patient/family education;Passive range of motion   PT Next Visit Plan prepare for D/C  Consulted and Agree with Plan of Care Patient      Patient will benefit from skilled therapeutic intervention in order to improve the following deficits and impairments:  Pain, Decreased strength, Improper body mechanics, Decreased activity tolerance, Decreased range of motion, Impaired flexibility  Visit Diagnosis: Bilateral low back pain without sciatica  Muscle weakness (generalized)  Abnormal posture  Pain in thoracic spine     Problem List Patient Active Problem List   Diagnosis Date Noted  . Thyroid disease   . Hypertension   . Low bone mass   . Atrophic vaginitis     Tanyon Alipio, PTA 02/29/2016, 11:37 AM  [facility] [address], [place], [place], [address] Phone: [phone]   Fax:  [phone]  Name: Crystal Lewis MRN: [id number] Date of Birth: Jun 09, 1941

## 2016-03-07 ENCOUNTER — Ambulatory Visit: Payer: Medicare Other | Attending: Physical Medicine and Rehabilitation | Admitting: Physical Therapy

## 2016-03-07 ENCOUNTER — Encounter: Payer: Self-pay | Admitting: Physical Therapy

## 2016-03-07 DIAGNOSIS — M546 Pain in thoracic spine: Secondary | ICD-10-CM | POA: Insufficient documentation

## 2016-03-07 DIAGNOSIS — R293 Abnormal posture: Secondary | ICD-10-CM | POA: Insufficient documentation

## 2016-03-07 DIAGNOSIS — M545 Low back pain, unspecified: Secondary | ICD-10-CM

## 2016-03-07 DIAGNOSIS — M6281 Muscle weakness (generalized): Secondary | ICD-10-CM | POA: Insufficient documentation

## 2016-03-07 NOTE — Therapy (Signed)
[facility] [address] [place], [place], [address] Phone: [phone]   Fax:  [phone]  Physical Therapy Treatment  Patient Details  Name: Crystal Lewis MRN: [id number] Date of Birth: 07/08/1941 Referring Provider: Margorie John, MD  Encounter Date: 03/07/2016      PT End of Session - 03/07/16 1150    Visit Number 8   Number of Visits 10   Date for PT Re-Evaluation 03/13/16   PT Start Time 2694   PT Stop Time 1225   PT Time Calculation (min) 41 min   Activity Tolerance Patient tolerated treatment well   Behavior During Therapy [facility] for tasks assessed/performed      Past Medical History  Diagnosis Date  . Hypertension   . Low bone mass   . Atrophic vaginitis   . Thyroid disease     Hypothyroid  . Allergy   . Depression   . Osteoporosis     Past Surgical History  Procedure Laterality Date  . Foot surgery    . Tubal ligation    . Cosmetic surgery      There were no vitals filed for this visit.                       [place] Adult PT Treatment/Exercise - 03/07/16 0001    Shoulder Exercises: Supine   Other Supine Exercises --  Supine TA contraction 10x added to HEP   Other Supine Exercises RT leg leg lengthener stretch 5x 3 sec   Shoulder Exercises: Seated   Horizontal ABduction Strengthening;Left;Theraband  3x 10   Theraband Level (Shoulder Horizontal ABduction) Level 3 (Green)   Horizontal ABduction Weight (lbs) --  Diagonols 2x 10 bil green band   Other Seated Exercises Lift the arms overhead with thoracic extension 10x   Shoulder Exercises: Pulleys   Flexion 3 minutes   Shoulder Exercises: ROM/Strengthening   UBE (Upper Arm Bike) L1 x 6 min reverse lumbar roll                   PT Short Term Goals - 02/08/16 1107    PT SHORT TERM GOAL #2   Title report a 30% reduction in thoracic pain with standing for washing dishes and doing housework   Time 4   Period Weeks   Status Achieved  30%           PT Long Term Goals - 03/07/16 1151    PT LONG TERM GOAL #2   Title reduce FOTO to < or = to 38% limitation   Time 8   Period Weeks   Status On-going  Will take next week/visit   PT LONG TERM GOAL #3   Title verbalize understanding of safe progresion of gym exercises to be performed at [facility]   Time 8   Period Weeks   Status Partially Met               Plan - 03/07/16 1150    Clinical Impression Statement Pt complinat with her HEP and independent in correct technique which inlcudes the ability to self correct her posture. She is prepared for discharge next session.    Rehab Potential Good   PT Frequency 2x / week   PT Duration 8 weeks   PT Treatment/Interventions ADLs/Self Care Home Management;Cryotherapy;Electrical Stimulation;Moist Heat;Therapeutic exercise;Therapeutic activities;Ultrasound;Neuromuscular re-education;Manual techniques;Patient/family education;Passive range of motion   PT Next Visit Plan DC next visit   Consulted and Agree  with Plan of Care Patient      Patient will benefit from skilled therapeutic intervention in order to improve the following deficits and impairments:  Pain, Decreased strength, Improper body mechanics, Decreased activity tolerance, Decreased range of motion, Impaired flexibility  Visit Diagnosis: Bilateral low back pain without sciatica  Muscle weakness (generalized)  Abnormal posture  Pain in thoracic spine     Problem List Patient Active Problem List   Diagnosis Date Noted  . Thyroid disease   . Hypertension   . Low bone mass   . Atrophic vaginitis     COCHRAN,JENNIFER, PTA 03/07/2016, 12:24 PM  [facility] [address] [place], [place], [address] Phone: [phone]   Fax:  [phone]  Name: Crystal Lewis MRN: [id number] Date of Birth: [birth year]

## 2016-03-13 ENCOUNTER — Ambulatory Visit: Payer: Medicare Other

## 2016-03-13 DIAGNOSIS — M546 Pain in thoracic spine: Secondary | ICD-10-CM

## 2016-03-13 DIAGNOSIS — R293 Abnormal posture: Secondary | ICD-10-CM

## 2016-03-13 DIAGNOSIS — M545 Low back pain, unspecified: Secondary | ICD-10-CM

## 2016-03-13 DIAGNOSIS — M6281 Muscle weakness (generalized): Secondary | ICD-10-CM

## 2016-03-13 NOTE — Therapy (Signed)
[facility] [address] [place], [place], [address] Phone: [phone]   Fax:  [phone]  Physical Therapy Treatment  Patient Details  Name: Crystal Lewis MRN: [id number] Date of Birth: 11/22/1940 Referring Provider: Margorie John, MD  Encounter Date: 03/13/2016      PT End of Session - 03/13/16 1137    Visit Number 9   PT Start Time 1100   PT Stop Time 1140   PT Time Calculation (min) 40 min   Activity Tolerance Patient tolerated treatment well   Behavior During Therapy [facility] for tasks assessed/performed      Past Medical History  Diagnosis Date  . Hypertension   . Low bone mass   . Atrophic vaginitis   . Thyroid disease     Hypothyroid  . Allergy   . Depression   . Osteoporosis     Past Surgical History  Procedure Laterality Date  . Foot surgery    . Tubal ligation    . Cosmetic surgery      There were no vitals filed for this visit.      Subjective Assessment - 03/13/16 1105    Subjective Ready to D/C to HEP and Tai Chi.  50% overall improvment in symptoms since the start of care.     Currently in Pain? No/denies            [facility] PT Assessment - 03/13/16 0001    Assessment   Medical Diagnosis chronic back pain, scoliosis, t-spine compression fracture   Onset Date/Surgical Date 01/16/14   Precautions   Precautions Fall;Other (comment)  osteporosis   Observation/Other Assessments   Focus on Therapeutic Outcomes (FOTO)  33% limitation                     [facility] Adult PT Treatment/Exercise - 03/13/16 0001    Shoulder Exercises: Seated   Horizontal ABduction Strengthening;Left;Theraband  3x 10   Theraband Level (Shoulder Horizontal ABduction) Level 3 (Green)   Horizontal ABduction Weight (lbs) --  Diagonols 2x 10 bil green band   Flexion Strengthening;Both;20 reps;Weights   Abduction Both;Strengthening;20 reps;Weights   ABduction Weight (lbs) 1   ABduction  Limitations scaption 1# 2x10   Other Seated Exercises Lift the arms overhead with thoracic extension 10x   Shoulder Exercises: Pulleys   Flexion 3 minutes   Shoulder Exercises: ROM/Strengthening   UBE (Upper Arm Bike) L1 x 6 min reverse lumbar roll                 PT Education - 03/13/16 1112    Education provided Yes   Education Details 3 way raises 1#   Person(s) Educated Patient   Methods Explanation;Demonstration;Handout   Comprehension Verbalized understanding;Returned demonstration          PT Short Term Goals - 02/08/16 1107    PT SHORT TERM GOAL #2   Title report a 30% reduction in thoracic pain with standing for washing dishes and doing housework   Time 4   Period Weeks   Status Achieved  30%           PT Long Term Goals - 03/13/16 1106    PT LONG TERM GOAL #1   Title be independent in advanced HEP   Status Achieved   PT LONG TERM GOAL #2   Title reduce FOTO to < or = to 38% limitation   Status Achieved   PT LONG TERM GOAL #3   Title verbalize  understanding of safe progresion of gym exercises to be performed at [facility]   Status Achieved   PT LONG TERM GOAL #4   Title report a 60% reduction in thoracic and lumbar pain with standing for housework   Status Partially Met  50% overall improvement               Plan - 24-Mar-2016 1107    Clinical Impression Statement Pt reports 50% overall improvement in symptoms since the start of care.  Pt is making consistent postural corrections and has been able to progress exercises for postural strength.  Pt will D/C to HEP today.     PT Next Visit Plan D/C PT to HEP   Consulted and Agree with Plan of Care Patient      Patient will benefit from skilled therapeutic intervention in order to improve the following deficits and impairments:     Visit Diagnosis: Bilateral low back pain without sciatica  Muscle weakness (generalized)  Abnormal posture  Pain in thoracic spine       G-Codes -  03/24/2016 1105    Functional Assessment Tool Used FOTO: 33% limitation   Functional Limitation Other PT primary   Other PT Primary Goal Status (F8182) At least 20 percent but less than 40 percent impaired, limited or restricted   Other PT Primary Discharge Status (X9371) At least 20 percent but less than 40 percent impaired, limited or restricted      Problem List Patient Active Problem List   Diagnosis Date Noted  . Thyroid disease   . Hypertension   . Low bone mass   . Atrophic vaginitis    PHYSICAL THERAPY DISCHARGE SUMMARY  Visits from Start of Care: 9  Current functional level related to goals / functional outcomes: Pt attended 9 PT sessions and reports 50% overall improvement since the start of care.  Pt will continue with HEP for continued gains.     Remaining deficits: See above for current status.   Education / Equipment: Banker, HEP Plan: Patient agrees to discharge.  Patient goals were partially met. Patient is being discharged due to being pleased with the current functional level.  ?????    Sigurd Sos, PT 2016-03-24 11:43 AM   [facility] [address] [place], [place], [address] Phone: [phone]   Fax:  [phone]  Name: Crystal Lewis MRN: [id number] Date of Birth: [birth year]

## 2016-03-13 NOTE — Patient Instructions (Signed)
SHOULDER: Flexion Unilateral (Weight)   Start with arm at side. Raise both arms forward and up to shoulder high  Keep elbows straight.  Use   lb weight.10  reps per set, 2-3___ sets per day.  Copyright  VHI. All rights reserved.  SHOULDER: Abduction (Weight)   Raise arm out and up. Keep elbow straight. Do not shrug shoulders. Use  # lb weight. _10__ reps per set, 2-_3__ sets per day.  Copyright  VHI. All rights reserved.  SHOULDER: Scaption (Weight)   Place arm at 45 angle to body. Raise arm up to shoulder level - shoulder keeping elbow straight.  Use   lb weight. _10__ reps per set, _2-3__ sets per day.  [facility] [address], [address] [place], [place] [address] Phone # [phone] Fax [phone]   

## 2017-01-13 DIAGNOSIS — D229 Melanocytic nevi, unspecified: Secondary | ICD-10-CM | POA: Insufficient documentation

## 2018-02-06 DIAGNOSIS — F330 Major depressive disorder, recurrent, mild: Secondary | ICD-10-CM | POA: Insufficient documentation

## 2019-04-05 DIAGNOSIS — M8580 Other specified disorders of bone density and structure, unspecified site: Secondary | ICD-10-CM | POA: Insufficient documentation

## 2020-06-17 ENCOUNTER — Other Ambulatory Visit (HOSPITAL_COMMUNITY): Payer: Self-pay | Admitting: Family Medicine

## 2020-06-17 ENCOUNTER — Other Ambulatory Visit: Payer: Self-pay | Admitting: Family Medicine

## 2020-06-20 ENCOUNTER — Other Ambulatory Visit: Payer: Self-pay | Admitting: Student

## 2020-06-20 ENCOUNTER — Other Ambulatory Visit (HOSPITAL_COMMUNITY): Payer: Self-pay | Admitting: Student

## 2020-06-20 ENCOUNTER — Other Ambulatory Visit (HOSPITAL_COMMUNITY): Payer: Self-pay | Admitting: Family Medicine

## 2020-06-20 ENCOUNTER — Other Ambulatory Visit: Payer: Self-pay | Admitting: Family Medicine

## 2020-06-30 ENCOUNTER — Other Ambulatory Visit (HOSPITAL_BASED_OUTPATIENT_CLINIC_OR_DEPARTMENT_OTHER): Payer: Self-pay | Admitting: General Surgery

## 2020-07-04 ENCOUNTER — Ambulatory Visit
Admission: RE | Admit: 2020-07-04 | Discharge: 2020-07-04 | Disposition: A | Discharge status: Home / Self Care | Payer: Self-pay | Source: Ambulatory Visit | Attending: Radiation Oncology | Admitting: Radiation Oncology

## 2020-07-04 ENCOUNTER — Other Ambulatory Visit: Payer: Self-pay | Admitting: Radiation Oncology

## 2020-07-04 DIAGNOSIS — C19 Malignant neoplasm of rectosigmoid junction: Secondary | ICD-10-CM

## 2020-07-05 ENCOUNTER — Ambulatory Visit: Payer: Medicare PPO

## 2020-07-05 ENCOUNTER — Telehealth: Payer: Self-pay | Admitting: Radiation Oncology

## 2020-07-05 ENCOUNTER — Ambulatory Visit
Admission: RE | Admit: 2020-07-05 | Discharge: 2020-07-05 | Disposition: A | Discharge status: Home / Self Care | Payer: Medicare PPO | Source: Ambulatory Visit | Attending: Radiation Oncology | Admitting: Radiation Oncology

## 2020-07-05 NOTE — Telephone Encounter (Signed)
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 Barry Dienes. I left a voicemail to let the patient know we needed to reschedule her visit to see medical oncology. I also left a message on her home voicemail and tried to reach her friend Collie Siad who's listed in her chart as a contact but no voicemail was available.

## 2020-07-05 NOTE — Progress Notes (Signed)
Appt cancelled

## 2020-07-05 NOTE — Progress Notes (Signed)
Met with patient in radiation oncology waiting room.  Patient was scheduled with them in error from referral from [place].  I explained to her the error and I have made her an appointment with Dr. Julieanne Manson on 08/02/2020 at 2 pm.  This will be 3 weeks post surgery (scheduled for 10/12).   She spent quite of bit of time talking about her journey thus far, expressing frustration.  I allowed her to vent and she was given my direct contact number should she have any questions or concerns.    I have messaged Dr. Barry Dienes to let her know she has been scheduled.

## 2020-07-06 NOTE — Progress Notes (Signed)
Your procedure is scheduled on Tuesday October 12.  Report to [facility] Main Entrance "A" at 07:30 A.M., and check in at the Admitting office.  Call this number if you have problems the morning of surgery: [phone]  Call [phone] if you have any questions prior to your surgery date Monday-Friday 8am-4pm   Remember: Do not eat or drink after midnight the night before your surgery  You may drink clear liquids until 06:30 A.M. the morning of your surgery.   Clear liquids allowed are: Water, Non-Citrus Juices (without pulp), Carbonated Beverages, Clear Tea, Black Coffee Only, and Gatorade  Please complete 2 PRE-SURGERY ENSURE before midnight, the night before surgery AND  1 PRE-SURGERY ENSURE by 06:30 A.M. the morning of your surgery.   Please, if able, drink it in one setting. DO NOT SIP.    Take these medicines the morning of surgery with A SIP OF WATER: buPROPion (WELLBUTRIN SR)  levothyroxine (SYNTHROID, LEVOTHROID)  liothyronine (CYTOMEL)  metroNIDAZOLE (FLAGYL)  neomycin (MYCIFRADIN)  raloxifene (EVISTA)  As of today, STOP taking any Aspirin (unless otherwise instructed by your surgeon), Aleve, Naproxen, Ibuprofen, Motrin, Advil, Goody's, BC's, all herbal medications, fish oil, and all vitamins.    The Morning of Surgery  Do not wear jewelry, make-up or nail polish.  Do not wear lotions, powders, or perfumes, or deodorant  Do not shave 48 hours prior to surgery.    Do not bring valuables to the hospital.  [facility] is not responsible for any belongings or valuables.  If you are a smoker, DO NOT Smoke 24 hours prior to surgery  If you wear a CPAP at night please bring your mask the morning of surgery   Remember that you must have someone to transport you home after your surgery, and remain with you for 24 hours if you are discharged the same day.   Please bring cases for contacts, glasses, hearing aids, dentures or bridgework because it cannot be worn into  surgery.    Leave your suitcase in the car.  After surgery it may be brought to your room.  For patients admitted to the hospital, discharge time will be determined by your treatment team.  Patients discharged the day of surgery will not be allowed to drive home.    Special instructions:   [facility]- Preparing For Surgery  Before surgery, you can play an important role. Because skin is not sterile, your skin needs to be as free of germs as possible. You can reduce the number of germs on your skin by washing with CHG (chlorahexidine gluconate) Soap before surgery.  CHG is an antiseptic cleaner which kills germs and bonds with the skin to continue killing germs even after washing.    Oral Hygiene is also important to reduce your risk of infection.  Remember - BRUSH YOUR TEETH THE MORNING OF SURGERY WITH YOUR REGULAR TOOTHPASTE  Please do not use if you have an allergy to CHG or antibacterial soaps. If your skin becomes reddened/irritated stop using the CHG.  Do not shave (including legs and underarms) for at least 48 hours prior to first CHG shower. It is OK to shave your face.  Please follow these instructions carefully.   1. Shower the NIGHT BEFORE SURGERY and the MORNING OF SURGERY with CHG Soap.   2. If you chose to wash your hair and body, wash as usual with your normal shampoo and body-wash/soap.  3. Rinse your hair and body thoroughly to remove the shampoo and  soap.  4. Apply CHG directly to the skin (ONLY FROM THE NECK DOWN) and wash gently with a scrungie or a clean washcloth.   5. Do not use on open wounds or open sores. Avoid contact with your eyes, ears, mouth and genitals (private parts). Wash Face and genitals (private parts)  with your normal soap.   6. Wash thoroughly, paying special attention to the area where your surgery will be performed.  7. Thoroughly rinse your body with warm water from the neck down.  8. DO NOT shower/wash with your normal soap after using  and rinsing off the CHG Soap.  9. Pat yourself dry with a CLEAN TOWEL.  10. Wear CLEAN PAJAMAS to bed the night before surgery  11. Place CLEAN SHEETS on your bed the night of your first shower and DO NOT SLEEP WITH PETS.  12. Wear comfortable clothes the morning of surgery.     Day of Surgery:  Please shower the morning of surgery with the CHG soap Do not apply any deodorants/lotions. Please wear clean clothes to the hospital/surgery center.   Remember to brush your teeth WITH YOUR REGULAR TOOTHPASTE.   Please read over the following fact sheets that you were given.

## 2020-07-07 ENCOUNTER — Other Ambulatory Visit: Payer: Self-pay

## 2020-07-07 ENCOUNTER — Encounter (HOSPITAL_COMMUNITY): Payer: Self-pay

## 2020-07-07 ENCOUNTER — Encounter (HOSPITAL_COMMUNITY)
Admission: RE | Admit: 2020-07-07 | Discharge: 2020-07-07 | Disposition: A | Discharge status: Home / Self Care | Payer: Medicare PPO | Source: Ambulatory Visit | Attending: General Surgery | Admitting: General Surgery

## 2020-07-07 DIAGNOSIS — Z01812 Encounter for preprocedural laboratory examination: Secondary | ICD-10-CM | POA: Diagnosis present

## 2020-07-07 HISTORY — DX: Hypothyroidism, unspecified: E03.9

## 2020-07-07 LAB — COMPREHENSIVE METABOLIC PANEL
ALT: 14 U/L (ref 0–44)
AST: 21 U/L (ref 15–41)
Albumin: 3.3 g/dL — ABNORMAL LOW (ref 3.5–5.0)
Alkaline Phosphatase: 54 U/L (ref 38–126)
Anion gap: 11 (ref 5–15)
BUN: 14 mg/dL (ref 8–23)
CO2: 30 mmol/L (ref 22–32)
Calcium: 9.6 mg/dL (ref 8.9–10.3)
Chloride: 99 mmol/L (ref 98–111)
Creatinine, Ser: 0.88 mg/dL (ref 0.44–1.00)
GFR calc non Af Amer: >60 mL/min (ref >60)
Glucose, Bld: 103 mg/dL — ABNORMAL HIGH (ref 70–99)
Potassium: 2.9 mmol/L — ABNORMAL LOW (ref 3.5–5.1)
Sodium: 140 mmol/L (ref 135–145)
Total Bilirubin: 0.6 mg/dL (ref 0.3–1.2)
Total Protein: 6.6 g/dL (ref 6.5–8.1)

## 2020-07-07 LAB — CBC WITH DIFFERENTIAL/PLATELET
Abs Immature Granulocytes: 0.02 10*3/uL (ref 0.00–0.07)
Basophils Absolute: 0.1 10*3/uL (ref 0.0–0.1)
Basophils Relative: 1 %
Eosinophils Absolute: 0.1 10*3/uL (ref 0.0–0.5)
Eosinophils Relative: 1 %
HCT: 39.5 % (ref 36.0–46.0)
Hemoglobin: 13.1 g/dL (ref 12.0–15.0)
Immature Granulocytes: 0 %
Lymphocytes Relative: 17 %
Lymphs Abs: 1.5 10*3/uL (ref 0.7–4.0)
MCH: 31.3 pg (ref 26.0–34.0)
MCHC: 33.2 g/dL (ref 30.0–36.0)
MCV: 94.5 fL (ref 80.0–100.0)
Monocytes Absolute: 0.8 10*3/uL (ref 0.1–1.0)
Monocytes Relative: 9 %
Neutro Abs: 6.2 10*3/uL (ref 1.7–7.7)
Neutrophils Relative %: 72 %
Platelets: 282 10*3/uL (ref 150–400)
RBC: 4.18 MIL/uL (ref 3.87–5.11)
RDW: 12.7 % (ref 11.5–15.5)
WBC: 8.7 10*3/uL (ref 4.0–10.5)
nRBC: 0 % (ref 0.0–0.2)

## 2020-07-07 LAB — HEMOGLOBIN A1C
Hgb A1c MFr Bld: 5.7 % — ABNORMAL HIGH (ref 4.8–5.6)
Mean Plasma Glucose: 116.89 mg/dL

## 2020-07-07 LAB — PROTIME-INR
INR: 1.1 (ref 0.8–1.2)
Prothrombin Time: 14.1 seconds (ref 11.4–15.2)

## 2020-07-07 NOTE — Progress Notes (Signed)
PCP - Merry Lofty, MD Cardiologist - pt denies    Chest x-ray - n/a EKG - 07/07/20 Stress Test - pt denies ECHO - pt denies Cardiac Cath - pt denies   Blood Thinner Instructions: n/a Aspirin Instructions: n/a  Pt stopped taking Evista 07/03/20  ERAS Protcol - yes PRE-SURGERY Ensure or G2- 2 ENSURE night before prior to surgery, 1 ENSURE morning of surgery by 0630  COVID TEST- .07/08/20  Coronavirus Screening  Have you experienced the following symptoms:  Cough yes/no: No Fever (>100.4F)  yes/no: No Runny nose yes/no: No Sore throat yes/no: No Difficulty breathing/shortness of breath  yes/no: No  Have you or a family member traveled in the last 14 days and where? yes/no: No   If the patient indicates "YES" to the above questions, their PAT will be rescheduled to limit the exposure to others and, the surgeon will be notified. THE PATIENT WILL NEED TO BE ASYMPTOMATIC FOR 14 DAYS.   If the patient is not experiencing any of these symptoms, the PAT nurse will instruct them to NOT bring anyone with them to their appointment since they may have these symptoms or traveled as well.   Please remind your patients and families that hospital visitation restrictions are in effect and the importance of the restrictions.     Anesthesia review: n/a  Patient denies shortness of breath, fever, cough and chest pain at PAT appointment   All instructions explained to the patient, with a verbal understanding of the material. Patient agrees to go over the instructions while at home for a better understanding. Patient also instructed to self quarantine after being tested for COVID-19. The opportunity to ask questions was provided.

## 2020-07-08 ENCOUNTER — Other Ambulatory Visit (HOSPITAL_COMMUNITY)
Admission: RE | Admit: 2020-07-08 | Discharge: 2020-07-08 | Disposition: A | Discharge status: Home / Self Care | Payer: Medicare PPO | Source: Ambulatory Visit | Attending: General Surgery | Admitting: General Surgery

## 2020-07-08 DIAGNOSIS — Z20822 Contact with and (suspected) exposure to covid-19: Secondary | ICD-10-CM | POA: Insufficient documentation

## 2020-07-08 DIAGNOSIS — Z01812 Encounter for preprocedural laboratory examination: Secondary | ICD-10-CM | POA: Diagnosis present

## 2020-07-08 LAB — SARS CORONAVIRUS 2 (TAT 6-24 HRS): SARS Coronavirus 2: NEGATIVE

## 2020-07-08 LAB — CEA: CEA: 1.6 ng/mL (ref 0.0–4.7)

## 2020-07-11 MED ORDER — BUPIVACAINE LIPOSOME 1.3 % IJ SUSP
20.0000 mL | Freq: Once | INTRAMUSCULAR | Status: DC
  Filled 2020-07-11: qty 20

## 2020-07-11 NOTE — Anesthesia Preprocedure Evaluation (Addendum)
Anesthesia Evaluation  Patient identified by MRN, date of birth, ID band Patient awake    Reviewed: Allergy & Precautions, NPO status , Patient's Chart, lab work & pertinent test results, reviewed documented beta blocker date and time   History of Anesthesia Complications Negative for: history of anesthetic complications  Airway Mallampati: I  TM Distance: >3 FB Neck ROM: Full    Dental  (+) Dental Advisory Given, Teeth Intact   Pulmonary neg pulmonary ROS,    Pulmonary exam normal        Cardiovascular hypertension, Pt. on medications and Pt. on home beta blockers Normal cardiovascular exam     Neuro/Psych PSYCHIATRIC DISORDERS Depression negative neurological ROS     GI/Hepatic negative GI ROS, Neg liver ROS,   Endo/Other  Hypothyroidism  Hypokalemia, K 2.9   Renal/GU negative Renal ROS     Musculoskeletal negative musculoskeletal ROS (+)   Abdominal   Peds  Hematology negative hematology ROS (+)   Anesthesia Other Findings Covid test negative   Reproductive/Obstetrics                            Anesthesia Physical Anesthesia Plan  ASA: II  Anesthesia Plan: General   Post-op Pain Management:    Induction: Intravenous  PONV Risk Score and Plan: 4 or greater and Treatment may vary due to age or medical condition, Ondansetron and Dexamethasone  Airway Management Planned: Oral ETT  Additional Equipment: None  Intra-op Plan:   Post-operative Plan: Extubation in OR  Informed Consent: I have reviewed the patients History and Physical, chart, labs and discussed the procedure including the risks, benefits and alternatives for the proposed anesthesia with the patient or authorized representative who has indicated his/her understanding and acceptance.     Dental advisory given  Plan Discussed with: CRNA and Anesthesiologist  Anesthesia Plan Comments:        Anesthesia  Quick Evaluation

## 2020-07-11 NOTE — H&P (Signed)
[name] Appointment: 07/01/2020 9:45 AM Location: [facility] Patient #: [id number] DOB: 1940-11-26 Widowed / Language: Crystal Lewis / Race: White Female   History of Present Illness Stark Klein MD; 07/01/2020 10:49 AM) The patient is a 79 year old female who presents with colorectal cancer. Pt is a 79 yo F who is referred by Dr. 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 She presented with rectal bleeding 5-6 weeks ago and had issues getting an urgent colonoscopy. She finally got it done by a [facility] doctor. She was seen to have a nearly obstructing colon cancer in the sigmoid colon that could not be traversed with the pediatric scope. She has been on a soft diet since then. She typically has been having a few small stools daily, but has had decreased stool for 2 days. She had another bloody stool today. Biopsy showed well differentiated adenocarcinoma. She had a staging CT that showed multiple liver lesions. 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 These were performed at [facility] note, she has significant family history of cancer with a maternal aunt dying of colon cancer at age 39. Her mother and antoher maternal aunt had breast cancer. She had a maternal first cousin as well with breast cancer. She has been on evista for prophylaxis of cancer and for menopause symptoms.    CT abd 06/20/2020 CONCLUSION:  Abnormal distal sigmoid colon described above worrisome for adenocarcinoma. No obstruction. Subcentimeter sigmoid mesocolon nodes however no adenopathy by CT criteria.  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  New low density well-defined liver lesions segments 2-3 largest 19 mm. MRI suggested for further characterization..  Several small cystic/bile duct enlargement in the head and uncinate process of the pancreas. Interval increase. MRI could further  characterize.  Ancillary findings above.    MR abdomen 06/22/2020 1. 1.9 cm Left hepatic lesion and multiple smaller lesions likely represents benign hepatic cysts. 2. 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   pathology from colonoscopy 06/12/2020 Invasive adenocarcinoma, well-differentiated.   Immunohistochemistry (IHC) Testing for Mismatch Repair (MMR) Proteins  Tissue: sigmoid colon, invasive adenocarcinoma  Block: [phone]  INTERPRETATION:  Retained nuclear expression of MLH1, PMS2, MSH2, and MSH6.  Based on this finding, there is no evidence of DNA mismatch repair gene deficiency (MMR proficient).    Past Surgical History (Crystal Lewis, [place]; 07/01/2020 9:23 AM) Foot Surgery  Left.  Allergies (Crystal Lewis, [place]; 07/01/2020 9:24 AM) Sulfa Antibiotics  Adhesive Tape *MEDICAL DEVICES AND SUPPLIES*  Allergies Reconciled   Medication History (Crystal Lewis, Lewis; 07/01/2020 9:26 AM) metroNIDAZOLE (1% Gel, External) Active. Atenolol-Chlorthalidone (50-25MG Tablet, Oral) Active. Synthroid (100MCG Tablet, Oral) Active. Raloxifene HCl (60MG Tablet, Oral) Active. buPROPion HCl ER (SR) (150MG Tablet ER 12HR, Oral) Active. Liothyronine Sodium (Oral) Specific strength unknown - Active. Azithromycin (Oral) Specific strength unknown - Active. Medications Reconciled  Social History Crystal Lewis, Lewis; 07/01/2020 9:23 AM) Tobacco use  Never smoker.  Family History Crystal Lewis, [place]; 07/01/2020 9:23 AM) Depression  Mother. Ischemic Bowel Disease  Family Members In General.    Review of Systems (Crystal Lewis; 07/01/2020 9:23 AM) HEENT Present- Seasonal Allergies. Not Present- Earache, Hearing Loss, Hoarseness, Nose Bleed, Oral Ulcers, Ringing in the Ears, Sinus Pain, Sore Throat, Visual Disturbances,  Wears glasses/contact lenses and  Yellow Eyes. Female Genitourinary Present- Urgency. Not Present- Frequency, Nocturia, Painful Urination and Pelvic Pain. Musculoskeletal Present- Back Pain. Not Present- Joint Pain, Joint Stiffness, Muscle Pain, Muscle Weakness and Swelling of Extremities. Neurological Not Present- Decreased Memory, Fainting, Headaches, Numbness, Seizures, Tingling, Tremor, Trouble walking and Weakness. Psychiatric Present- Depression. Not Present- Anxiety, Bipolar, Change in Sleep Pattern, Fearful and Frequent crying.  Vitals (Crystal Lewis; 07/01/2020 9:26 AM) 07/01/2020 9:26 AM Weight: 138.5 lb Height: 63.5in Body Surface Area: 1.66 m Body Mass Index: 24.15 kg/m  Temp.: 97.22F  Pulse: 82 (Regular)  BP: 126/78(Sitting, Left Arm, Standard)       Physical Exam Stark Klein MD; 07/01/2020 10:48 AM) General Mental Status-Alert. General Appearance-Consistent with stated age. Hydration-Well hydrated. Voice-Normal.  Head and Neck Head-normocephalic, atraumatic with no lesions or palpable masses. Trachea-midline. Thyroid Gland Characteristics - normal size and consistency.  Eye Eyeball - Bilateral-Extraocular movements intact. Sclera/Conjunctiva - Bilateral-No scleral icterus.  Chest and Lung Exam Chest and lung exam reveals -quiet, even and easy respiratory effort with no use of accessory muscles and on auscultation, normal breath sounds, no adventitious sounds and normal vocal resonance. Inspection Chest Wall - Normal. Back - normal.  Cardiovascular Cardiovascular examination reveals -normal heart sounds, regular rate and rhythm with no murmurs and normal pedal pulses bilaterally.  Abdomen Inspection Inspection of the abdomen reveals - No Hernias. Palpation/Percussion Palpation and Percussion of the abdomen reveal - Soft, Non Tender, No Rebound tenderness, No Rigidity (guarding) and No  hepatosplenomegaly. Auscultation Auscultation of the abdomen reveals - Bowel sounds normal.  Neurologic Neurologic evaluation reveals -alert and oriented x 3 with no impairment of recent or remote memory. Mental Status-Normal.  Musculoskeletal Global Assessment -Note: no gross deformities.  Normal Exam - Left-Upper Extremity Strength Normal and Lower Extremity Strength Normal. Normal Exam - Right-Upper Extremity Strength Normal and Lower Extremity Strength Normal.  Lymphatic Head & Neck  General Head & Neck Lymphatics: Bilateral - Description - Normal. Axillary  General Axillary Region: Bilateral - Description - Normal. Tenderness - Non Tender. Femoral & Inguinal  Generalized Femoral & Inguinal Lymphatics: Bilateral - Description - No Generalized lymphadenopathy.    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 Will do this expeditiously. Hopefully I can get this done next week. I have advised her to go to full liquids only. We discussed protein sources. I advised her to go to [facility] if she has bleeding.  I discussed robotic or laparoscopic sigmoid colectomy, depending on what OR is available. She will need repeat colonoscopy post op to clear the rest of the colon.  I discussed risk of leak and of ostomy. I also discussed risk of damage to adjacent structures and risk of blood clot, heart or lung issues, death, wound infection. She wishes to proceed. I discussed recovery time and time in the hospital as well.  We will do this as soon as possible. Current Plans Pt Education - flb sigmoid colon: discussed with patient and provided information. You are being scheduled for surgery- Our schedulers will call you.  You should hear from our office's scheduling department within 5 working days about the location, date, and time of surgery. We try to make accommodations for  patient's preferences in scheduling surgery, but sometimes the OR schedule or the surgeon's schedule prevents [place] from making those accommodations.  If you have not heard from our office [phone]) in 5 working days, call the office and ask for your  surgeon's nurse.  If you have other questions about your diagnosis, plan, or surgery, call the office and ask for your surgeon's nurse.  Pt Education - [facility] Colon Bowel Prep 2018 ERAS/Miralax/Antibiotics Started Neomycin Sulfate 500 MG Oral Tablet, 2 (two) Tablet SEE NOTE, #6, 07/01/2020, No Refill. Local Order: Pharmacist Notes: TAKE TWO TABLETS AT 2 PM, 3 PM, AND 10 PM THE DAY PRIOR TO SURGERY Started Flagyl 500 MG Oral Tablet, 2 (two) Tablet SEE NOTE, #6, 07/01/2020, No Refill. Local Order: Pharmacist Notes: Take at 2pm, 3pm, and 10pm the day prior to your colon operation Referred to Oncology, for evaluation and follow up (Oncology). Routine. Referred to Genetic Counseling, for evaluation and follow up [employer]). Routine. 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 Will follow. 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 Follow up CEA and MR.    Signed electronically by Stark Klein, MD (07/01/2020 10:53 AM)

## 2020-07-12 ENCOUNTER — Inpatient Hospital Stay (HOSPITAL_COMMUNITY): Payer: Medicare PPO | Admitting: Physician Assistant

## 2020-07-12 ENCOUNTER — Inpatient Hospital Stay (HOSPITAL_COMMUNITY): Payer: Medicare PPO | Admitting: Certified Registered Nurse Anesthetist

## 2020-07-12 ENCOUNTER — Encounter (HOSPITAL_COMMUNITY)
Admission: RE | Disposition: A | Discharge status: Home / Self Care | Payer: Self-pay | Source: Home / Self Care | Attending: General Surgery

## 2020-07-12 ENCOUNTER — Encounter (HOSPITAL_COMMUNITY): Payer: Self-pay | Admitting: General Surgery

## 2020-07-12 ENCOUNTER — Other Ambulatory Visit: Payer: Self-pay

## 2020-07-12 ENCOUNTER — Inpatient Hospital Stay (HOSPITAL_COMMUNITY)
Admission: RE | Admit: 2020-07-12 | Discharge: 2020-07-15 | DRG: 330 | Disposition: A | Discharge status: Home / Self Care | Payer: Medicare PPO | Attending: General Surgery | Admitting: General Surgery

## 2020-07-12 DIAGNOSIS — Z800 Family history of malignant neoplasm of digestive organs: Secondary | ICD-10-CM | POA: Diagnosis not present

## 2020-07-12 DIAGNOSIS — Z803 Family history of malignant neoplasm of breast: Secondary | ICD-10-CM

## 2020-07-12 DIAGNOSIS — K862 Cyst of pancreas: Secondary | ICD-10-CM | POA: Diagnosis present

## 2020-07-12 DIAGNOSIS — K769 Liver disease, unspecified: Secondary | ICD-10-CM | POA: Diagnosis present

## 2020-07-12 DIAGNOSIS — K5669 Other partial intestinal obstruction: Secondary | ICD-10-CM | POA: Diagnosis present

## 2020-07-12 DIAGNOSIS — C187 Malignant neoplasm of sigmoid colon: Secondary | ICD-10-CM | POA: Diagnosis present

## 2020-07-12 DIAGNOSIS — E876 Hypokalemia: Secondary | ICD-10-CM | POA: Diagnosis present

## 2020-07-12 DIAGNOSIS — Z882 Allergy status to sulfonamides status: Secondary | ICD-10-CM | POA: Diagnosis not present

## 2020-07-12 DIAGNOSIS — K5790 Diverticulosis of intestine, part unspecified, without perforation or abscess without bleeding: Secondary | ICD-10-CM | POA: Diagnosis present

## 2020-07-12 LAB — CBC
HCT: 34.2 % — ABNORMAL LOW (ref 36.0–46.0)
Hemoglobin: 11.7 g/dL — ABNORMAL LOW (ref 12.0–15.0)
MCH: 31.5 pg (ref 26.0–34.0)
MCHC: 34.2 g/dL (ref 30.0–36.0)
MCV: 92.2 fL (ref 80.0–100.0)
Platelets: 232 10*3/uL (ref 150–400)
RBC: 3.71 MIL/uL — ABNORMAL LOW (ref 3.87–5.11)
RDW: 12.5 % (ref 11.5–15.5)
WBC: 15.4 10*3/uL — ABNORMAL HIGH (ref 4.0–10.5)
nRBC: 0 % (ref 0.0–0.2)

## 2020-07-12 LAB — TYPE AND SCREEN
ABO/RH(D): O POS
Antibody Screen: NEGATIVE

## 2020-07-12 LAB — CREATININE, SERUM
Creatinine, Ser: 1.05 mg/dL — ABNORMAL HIGH (ref 0.44–1.00)
GFR, Estimated: 51 mL/min — ABNORMAL LOW (ref >60)

## 2020-07-12 LAB — ABO/RH: ABO/RH(D): O POS

## 2020-07-12 SURGERY — COLECTOMY, SIGMOID, ROBOT-ASSISTED
Anesthesia: General | Site: Abdomen

## 2020-07-12 MED ORDER — STERILE WATER FOR IRRIGATION IR SOLN
Status: DC | PRN
  Administered 2020-07-12: 1000 mL

## 2020-07-12 MED ORDER — CHLORHEXIDINE GLUCONATE 0.12 % MT SOLN
15.0000 mL | Freq: Once | OROMUCOSAL | Status: AC
  Filled 2020-07-12: qty 15

## 2020-07-12 MED ORDER — ENSURE PRE-SURGERY PO LIQD: 592.0000 mL | Freq: Once | ORAL | Status: DC

## 2020-07-12 MED ORDER — ONDANSETRON HCL 4 MG/2ML IJ SOLN
INTRAMUSCULAR | Status: DC | PRN
  Administered 2020-07-12: 4 mg via INTRAVENOUS

## 2020-07-12 MED ORDER — OXYCODONE HCL 5 MG PO TABS: 5.0000 mg | ORAL_TABLET | Freq: Once | ORAL | Status: DC | PRN

## 2020-07-12 MED ORDER — SODIUM CHLORIDE 0.9 % IR SOLN
Status: DC | PRN
  Administered 2020-07-12: 1000 mL

## 2020-07-12 MED ORDER — ALVIMOPAN 12 MG PO CAPS
12.0000 mg | ORAL_CAPSULE | ORAL | Status: AC
  Administered 2020-07-12: 12 mg via ORAL
  Filled 2020-07-12: qty 1

## 2020-07-12 MED ORDER — BUPIVACAINE HCL (PF) 0.25 % IJ SOLN
INTRAMUSCULAR | Status: DC | PRN
  Administered 2020-07-12: 10 mL

## 2020-07-12 MED ORDER — ACETAMINOPHEN 325 MG PO TABS
650.0000 mg | ORAL_TABLET | Freq: Four times a day (QID) | ORAL | Status: DC | PRN
  Administered 2020-07-12: 650 mg via ORAL
  Filled 2020-07-12: qty 2

## 2020-07-12 MED ORDER — ENOXAPARIN SODIUM 40 MG/0.4ML ~~LOC~~ SOLN
40.0000 mg | SUBCUTANEOUS | Status: DC
  Administered 2020-07-13 – 2020-07-14 (×2): 40 mg via SUBCUTANEOUS
  Filled 2020-07-12 (×3): qty 0.4

## 2020-07-12 MED ORDER — RALOXIFENE HCL 60 MG PO TABS
60.0000 mg | ORAL_TABLET | Freq: Every day | ORAL | Status: DC
  Filled 2020-07-12: qty 1

## 2020-07-12 MED ORDER — GABAPENTIN 100 MG PO CAPS
100.0000 mg | ORAL_CAPSULE | Freq: Two times a day (BID) | ORAL | Status: DC
  Administered 2020-07-12 – 2020-07-15 (×7): 100 mg via ORAL
  Filled 2020-07-12 (×7): qty 1

## 2020-07-12 MED ORDER — ROCURONIUM BROMIDE 10 MG/ML (PF) SYRINGE
PREFILLED_SYRINGE | INTRAVENOUS | Status: DC | PRN
  Administered 2020-07-12: 20 mg via INTRAVENOUS
  Administered 2020-07-12: 50 mg via INTRAVENOUS

## 2020-07-12 MED ORDER — LIDOCAINE 2% (20 MG/ML) 5 ML SYRINGE
INTRAMUSCULAR | Status: AC
  Filled 2020-07-12: qty 5

## 2020-07-12 MED ORDER — FENTANYL CITRATE (PF) 100 MCG/2ML IJ SOLN: 25.0000 ug | INTRAMUSCULAR | Status: DC | PRN

## 2020-07-12 MED ORDER — ONDANSETRON HCL 4 MG PO TABS: 4.0000 mg | ORAL_TABLET | Freq: Four times a day (QID) | ORAL | Status: DC | PRN

## 2020-07-12 MED ORDER — FENTANYL CITRATE (PF) 250 MCG/5ML IJ SOLN
INTRAMUSCULAR | Status: DC | PRN
Start: 2020-07-12 — End: 2020-07-12
  Administered 2020-07-12: 100 ug via INTRAVENOUS
  Administered 2020-07-12: 50 ug via INTRAVENOUS

## 2020-07-12 MED ORDER — PROCHLORPERAZINE MALEATE 10 MG PO TABS
10.0000 mg | ORAL_TABLET | Freq: Four times a day (QID) | ORAL | Status: DC | PRN
  Filled 2020-07-12: qty 1

## 2020-07-12 MED ORDER — OXYCODONE HCL 5 MG/5ML PO SOLN: 5.0000 mg | Freq: Once | ORAL | Status: DC | PRN

## 2020-07-12 MED ORDER — DIPHENHYDRAMINE HCL 50 MG/ML IJ SOLN: 12.5000 mg | Freq: Four times a day (QID) | INTRAMUSCULAR | Status: DC | PRN

## 2020-07-12 MED ORDER — LEVOTHYROXINE SODIUM 100 MCG PO TABS
100.0000 ug | ORAL_TABLET | Freq: Every day | ORAL | Status: DC
  Filled 2020-07-12: qty 1

## 2020-07-12 MED ORDER — CHLORHEXIDINE GLUCONATE CLOTH 2 % EX PADS: 6.0000 | MEDICATED_PAD | Freq: Once | CUTANEOUS | Status: DC

## 2020-07-12 MED ORDER — 0.9 % SODIUM CHLORIDE (POUR BTL) OPTIME
TOPICAL | Status: DC | PRN
  Administered 2020-07-12 (×2): 1000 mL

## 2020-07-12 MED ORDER — BUPIVACAINE LIPOSOME 1.3 % IJ SUSP
20.0000 mL | Freq: Once | INTRAMUSCULAR | Status: AC
  Administered 2020-07-12: 20 mL
  Filled 2020-07-12: qty 20

## 2020-07-12 MED ORDER — ATENOLOL-CHLORTHALIDONE 50-25 MG PO TABS: 1.0000 | ORAL_TABLET | Freq: Every day | ORAL | Status: DC

## 2020-07-12 MED ORDER — SUGAMMADEX SODIUM 200 MG/2ML IV SOLN
INTRAVENOUS | Status: DC | PRN
  Administered 2020-07-12: 250 mg via INTRAVENOUS

## 2020-07-12 MED ORDER — SODIUM CHLORIDE 0.9 % IV SOLN
INTRAVENOUS | Status: DC
  Filled 2020-07-12: qty 6

## 2020-07-12 MED ORDER — LACTATED RINGERS IV SOLN: INTRAVENOUS | Status: DC | PRN

## 2020-07-12 MED ORDER — ONDANSETRON HCL 4 MG/2ML IJ SOLN: 4.0000 mg | Freq: Once | INTRAMUSCULAR | Status: DC | PRN

## 2020-07-12 MED ORDER — KCL IN DEXTROSE-NACL 20-5-0.45 MEQ/L-%-% IV SOLN
INTRAVENOUS | Status: DC
  Filled 2020-07-12 (×3): qty 1000

## 2020-07-12 MED ORDER — BUPIVACAINE HCL (PF) 0.25 % IJ SOLN
INTRAMUSCULAR | Status: AC
  Filled 2020-07-12: qty 30

## 2020-07-12 MED ORDER — PROCHLORPERAZINE EDISYLATE 10 MG/2ML IJ SOLN: 5.0000 mg | Freq: Four times a day (QID) | INTRAMUSCULAR | Status: DC | PRN

## 2020-07-12 MED ORDER — HEPARIN SODIUM (PORCINE) 5000 UNIT/ML IJ SOLN
5000.0000 [IU] | Freq: Once | INTRAMUSCULAR | Status: AC
  Administered 2020-07-12: 5000 [IU] via SUBCUTANEOUS
  Filled 2020-07-12: qty 1

## 2020-07-12 MED ORDER — ONDANSETRON HCL 4 MG/2ML IJ SOLN: 4.0000 mg | Freq: Four times a day (QID) | INTRAMUSCULAR | Status: DC | PRN

## 2020-07-12 MED ORDER — PHENYLEPHRINE HCL-NACL 10-0.9 MG/250ML-% IV SOLN
INTRAVENOUS | Status: DC | PRN
  Administered 2020-07-12: 25 ug/min via INTRAVENOUS

## 2020-07-12 MED ORDER — ACETAMINOPHEN 500 MG PO TABS
1000.0000 mg | ORAL_TABLET | ORAL | Status: AC
  Administered 2020-07-12: 1000 mg via ORAL
  Filled 2020-07-12: qty 2

## 2020-07-12 MED ORDER — DIPHENHYDRAMINE HCL 12.5 MG/5ML PO ELIX: 12.5000 mg | ORAL_SOLUTION | Freq: Four times a day (QID) | ORAL | Status: DC | PRN

## 2020-07-12 MED ORDER — PROPOFOL 10 MG/ML IV BOLUS
INTRAVENOUS | Status: DC | PRN
  Administered 2020-07-12: 150 mg via INTRAVENOUS

## 2020-07-12 MED ORDER — MIDAZOLAM HCL 2 MG/2ML IJ SOLN
INTRAMUSCULAR | Status: DC | PRN
  Administered 2020-07-12: 1 mg via INTRAVENOUS

## 2020-07-12 MED ORDER — MIDAZOLAM HCL 2 MG/2ML IJ SOLN
INTRAMUSCULAR | Status: AC
  Filled 2020-07-12: qty 2

## 2020-07-12 MED ORDER — LIDOCAINE-EPINEPHRINE (PF) 1 %-1:200000 IJ SOLN
INTRAMUSCULAR | Status: AC
  Filled 2020-07-12: qty 30

## 2020-07-12 MED ORDER — MORPHINE SULFATE (PF) 2 MG/ML IV SOLN: 1.0000 mg | INTRAVENOUS | Status: DC | PRN

## 2020-07-12 MED ORDER — FENTANYL CITRATE (PF) 250 MCG/5ML IJ SOLN
INTRAMUSCULAR | Status: AC
  Filled 2020-07-12: qty 5

## 2020-07-12 MED ORDER — CHLORTHALIDONE 25 MG PO TABS
25.0000 mg | ORAL_TABLET | Freq: Every day | ORAL | Status: DC
  Administered 2020-07-13: 25 mg via ORAL
  Filled 2020-07-12 (×3): qty 1

## 2020-07-12 MED ORDER — ALUM & MAG HYDROXIDE-SIMETH 200-200-20 MG/5ML PO SUSP: 30.0000 mL | Freq: Four times a day (QID) | ORAL | Status: DC | PRN

## 2020-07-12 MED ORDER — LIDOCAINE-EPINEPHRINE 1 %-1:100000 IJ SOLN
INTRAMUSCULAR | Status: DC | PRN
  Administered 2020-07-12: 10 mL

## 2020-07-12 MED ORDER — EPHEDRINE SULFATE 50 MG/ML IJ SOLN
INTRAMUSCULAR | Status: DC | PRN
  Administered 2020-07-12 (×4): 10 mg via INTRAVENOUS

## 2020-07-12 MED ORDER — OXYCODONE HCL 5 MG PO TABS
5.0000 mg | ORAL_TABLET | ORAL | Status: DC | PRN
  Administered 2020-07-14: 5 mg via ORAL
  Filled 2020-07-12 (×2): qty 1

## 2020-07-12 MED ORDER — PROPOFOL 10 MG/ML IV BOLUS
INTRAVENOUS | Status: AC
  Filled 2020-07-12: qty 20

## 2020-07-12 MED ORDER — DEXAMETHASONE SODIUM PHOSPHATE 10 MG/ML IJ SOLN
INTRAMUSCULAR | Status: AC
  Filled 2020-07-12: qty 1

## 2020-07-12 MED ORDER — CLINDAMYCIN PHOSPHATE 900 MG/50ML IV SOLN
INTRAVENOUS | Status: AC
  Filled 2020-07-12: qty 50

## 2020-07-12 MED ORDER — PHENYLEPHRINE 40 MCG/ML (10ML) SYRINGE FOR IV PUSH (FOR BLOOD PRESSURE SUPPORT)
PREFILLED_SYRINGE | INTRAVENOUS | Status: DC | PRN
  Administered 2020-07-12: 80 ug via INTRAVENOUS

## 2020-07-12 MED ORDER — ATENOLOL 25 MG PO TABS
50.0000 mg | ORAL_TABLET | Freq: Every day | ORAL | Status: DC
  Administered 2020-07-13: 50 mg via ORAL
  Filled 2020-07-12 (×2): qty 2

## 2020-07-12 MED ORDER — LIOTHYRONINE SODIUM 5 MCG PO TABS
5.0000 ug | ORAL_TABLET | Freq: Every day | ORAL | Status: DC
  Filled 2020-07-12: qty 1

## 2020-07-12 MED ORDER — ENSURE PRE-SURGERY PO LIQD: 296.0000 mL | Freq: Once | ORAL | Status: DC

## 2020-07-12 MED ORDER — SODIUM CHLORIDE 0.9 % IV SOLN
2.0000 g | INTRAVENOUS | Status: AC
  Administered 2020-07-12: 2 g via INTRAVENOUS
  Filled 2020-07-12: qty 2

## 2020-07-12 MED ORDER — DEXAMETHASONE SODIUM PHOSPHATE 10 MG/ML IJ SOLN
INTRAMUSCULAR | Status: DC | PRN
  Administered 2020-07-12: 4 mg via INTRAVENOUS

## 2020-07-12 MED ORDER — SIMETHICONE 80 MG PO CHEW: 40.0000 mg | CHEWABLE_TABLET | Freq: Four times a day (QID) | ORAL | Status: DC | PRN

## 2020-07-12 MED ORDER — SODIUM CHLORIDE 0.9 % IV SOLN
2.0000 g | Freq: Two times a day (BID) | INTRAVENOUS | Status: AC
  Administered 2020-07-12: 2 g via INTRAVENOUS
  Filled 2020-07-12: qty 2

## 2020-07-12 MED ORDER — LIDOCAINE 2% (20 MG/ML) 5 ML SYRINGE
INTRAMUSCULAR | Status: DC | PRN
  Administered 2020-07-12: 40 mg via INTRAVENOUS

## 2020-07-12 MED ORDER — CHLORHEXIDINE GLUCONATE 0.12 % MT SOLN
OROMUCOSAL | Status: AC
  Administered 2020-07-12: 15 mL via OROMUCOSAL
  Filled 2020-07-12: qty 15

## 2020-07-12 MED ORDER — ENSURE SURGERY PO LIQD
237.0000 mL | Freq: Two times a day (BID) | ORAL | Status: DC
  Administered 2020-07-13 – 2020-07-15 (×5): 237 mL via ORAL
  Filled 2020-07-12 (×9): qty 237

## 2020-07-12 MED ORDER — ALVIMOPAN 12 MG PO CAPS
12.0000 mg | ORAL_CAPSULE | Freq: Two times a day (BID) | ORAL | Status: DC
  Administered 2020-07-13 (×2): 12 mg via ORAL
  Filled 2020-07-12 (×2): qty 1

## 2020-07-12 MED ORDER — BUPROPION HCL ER (SR) 150 MG PO TB12
150.0000 mg | ORAL_TABLET | Freq: Every day | ORAL | Status: DC
  Administered 2020-07-14 – 2020-07-15 (×2): 150 mg via ORAL
  Filled 2020-07-12 (×3): qty 1

## 2020-07-12 MED ORDER — GABAPENTIN 100 MG PO CAPS
100.0000 mg | ORAL_CAPSULE | ORAL | Status: AC
  Administered 2020-07-12: 100 mg via ORAL
  Filled 2020-07-12: qty 1

## 2020-07-12 MED ORDER — ONDANSETRON HCL 4 MG/2ML IJ SOLN
INTRAMUSCULAR | Status: AC
  Filled 2020-07-12: qty 2

## 2020-07-12 SURGICAL SUPPLY — 104 items
BLADE EXTENDED COATED 6.5IN (ELECTRODE) IMPLANT
CANNULA REDUC XI 12-8 STAPL (CANNULA) ×2
CANNULA REDUCER 12-8 DVNC XI (CANNULA) ×1 IMPLANT
CELLS DAT CNTRL 66122 CELL SVR (MISCELLANEOUS) IMPLANT
CHLORAPREP W/TINT 26 (MISCELLANEOUS) ×2 IMPLANT
CLIP VESOLOCK LG 6/CT PURPLE (CLIP) IMPLANT
CLIP VESOLOCK MED 6/CT (CLIP) IMPLANT
COVER MAYO STAND STRL (DRAPES) ×2 IMPLANT
COVER SURGICAL LIGHT HANDLE (MISCELLANEOUS) ×2 IMPLANT
COVER TIP SHEARS 8 DVNC (MISCELLANEOUS) IMPLANT
COVER TIP SHEARS 8MM DA VINCI (MISCELLANEOUS)
COVER WAND RF STERILE (DRAPES) IMPLANT
DECANTER SPIKE VIAL GLASS SM (MISCELLANEOUS) ×2 IMPLANT
DEFOGGER SCOPE WARMER CLEARIFY (MISCELLANEOUS) ×2 IMPLANT
DERMABOND ADVANCED (GAUZE/BANDAGES/DRESSINGS) ×1
DERMABOND ADVANCED .7 DNX12 (GAUZE/BANDAGES/DRESSINGS) ×1 IMPLANT
DEVICE TROCAR PUNCTURE CLOSURE (ENDOMECHANICALS) IMPLANT
DRAIN CHANNEL 19F RND (DRAIN) IMPLANT
DRAPE ARM DVNC X/XI (DISPOSABLE) ×4 IMPLANT
DRAPE COLUMN DVNC XI (DISPOSABLE) ×1 IMPLANT
DRAPE CV SPLIT W-CLR ANES SCRN (DRAPES) IMPLANT
DRAPE DA VINCI XI ARM (DISPOSABLE) ×8
DRAPE DA VINCI XI COLUMN (DISPOSABLE) ×2
DRAPE ORTHO SPLIT 77X108 STRL (DRAPES)
DRAPE SURG ORHT 6 SPLT 77X108 (DRAPES) IMPLANT
DRSG OPSITE POSTOP 4X6 (GAUZE/BANDAGES/DRESSINGS) IMPLANT
DRSG OPSITE POSTOP 4X8 (GAUZE/BANDAGES/DRESSINGS) IMPLANT
ELECT CAUTERY BLADE 6.4 (BLADE) ×4 IMPLANT
ELECT REM PT RETURN 9FT ADLT (ELECTROSURGICAL) ×2
ELECTRODE REM PT RTRN 9FT ADLT (ELECTROSURGICAL) ×1 IMPLANT
ENDOLOOP SUT PDS II  0 18 (SUTURE)
ENDOLOOP SUT PDS II 0 18 (SUTURE) IMPLANT
EVACUATOR SILICONE 100CC (DRAIN) IMPLANT
GAUZE 4X4 16PLY RFD (DISPOSABLE) ×2 IMPLANT
GAUZE SPONGE 4X4 12PLY STRL (GAUZE/BANDAGES/DRESSINGS) IMPLANT
GLOVE BIO SURGEON STRL SZ 6 (GLOVE) ×6 IMPLANT
GLOVE INDICATOR 6.5 STRL GRN (GLOVE) ×6 IMPLANT
GOWN STRL REUS W/ TWL LRG LVL3 (GOWN DISPOSABLE) ×2 IMPLANT
GOWN STRL REUS W/ TWL XL LVL3 (GOWN DISPOSABLE) ×2 IMPLANT
GOWN STRL REUS W/TWL 2XL LVL3 (GOWN DISPOSABLE) ×6 IMPLANT
GOWN STRL REUS W/TWL LRG LVL3 (GOWN DISPOSABLE) ×4
GOWN STRL REUS W/TWL XL LVL3 (GOWN DISPOSABLE) ×4
KIT BASIN OR (CUSTOM PROCEDURE TRAY) ×2 IMPLANT
KIT TURNOVER KIT B (KITS) IMPLANT
LEGGING LITHOTOMY PAIR STRL (DRAPES) IMPLANT
NEEDLE HYPO 22GX1.5 SAFETY (NEEDLE) ×2 IMPLANT
NEEDLE INSUFFLATION 14GA 120MM (NEEDLE) ×2 IMPLANT
OBTURATOR OPTICAL STANDARD 8MM (TROCAR)
OBTURATOR OPTICAL STND 8 DVNC (TROCAR)
OBTURATOR OPTICALSTD 8 DVNC (TROCAR) IMPLANT
PACK COLON (CUSTOM PROCEDURE TRAY) IMPLANT
PAD ARMBOARD 7.5X6 YLW CONV (MISCELLANEOUS) ×4 IMPLANT
PENCIL SMOKE EVACUATOR (MISCELLANEOUS) IMPLANT
PORT LAP GEL ALEXIS MED 5-9CM (MISCELLANEOUS) ×2 IMPLANT
POUCH LAPAROSCOPIC INSTRUMENT (MISCELLANEOUS) ×2 IMPLANT
RELOAD STAPLER 3.5X60 BLU DVNC (STAPLE) ×2 IMPLANT
RELOAD STAPLER 4.3X60 GRN DVNC (STAPLE) IMPLANT
RTRCTR WOUND ALEXIS 18CM MED (MISCELLANEOUS)
SCISSORS LAP 5X35 DISP (ENDOMECHANICALS) IMPLANT
SEAL CANN UNIV 5-8 DVNC XI (MISCELLANEOUS) ×3 IMPLANT
SEAL XI 5MM-8MM UNIVERSAL (MISCELLANEOUS) ×6
SEALER VESSEL DA VINCI XI (MISCELLANEOUS) ×2
SEALER VESSEL EXT DVNC XI (MISCELLANEOUS) ×1 IMPLANT
SET IRRIG TUBING LAPAROSCOPIC (IRRIGATION / IRRIGATOR) ×2 IMPLANT
SET TUBE SMOKE EVAC HIGH FLOW (TUBING) ×2 IMPLANT
SLEEVE XCEL OPT CAN 5 100 (ENDOMECHANICALS) IMPLANT
SPONGE LAP 18X18 RF (DISPOSABLE) ×2 IMPLANT
STAPLER 60 DA VINCI SURE FORM (STAPLE) ×2
STAPLER 60 SUREFORM DVNC (STAPLE) ×1 IMPLANT
STAPLER CANNULA SEAL DVNC XI (STAPLE) IMPLANT
STAPLER CANNULA SEAL XI (STAPLE)
STAPLER RELOAD 3.5X60 BLU DVNC (STAPLE) ×2
STAPLER RELOAD 3.5X60 BLUE (STAPLE) ×4
STAPLER RELOAD 4.3X60 GREEN (STAPLE)
STAPLER RELOAD 4.3X60 GRN DVNC (STAPLE)
STAPLER VISISTAT 35W (STAPLE) ×2 IMPLANT
STOPCOCK 4 WAY LG BORE MALE ST (IV SETS) ×2 IMPLANT
SURGILUBE 2OZ TUBE FLIPTOP (MISCELLANEOUS) IMPLANT
SUT DVC VLOC 180 2-0 12IN GS21 (SUTURE)
SUT ETHILON 2 0 FS 18 (SUTURE) IMPLANT
SUT MNCRL AB 4-0 PS2 18 (SUTURE) ×4 IMPLANT
SUT NOVA NAB DX-16 0-1 5-0 T12 (SUTURE) IMPLANT
SUT PDS AB 0 CT1 27 (SUTURE) ×2 IMPLANT
SUT PDS AB 1 CTX 36 (SUTURE) IMPLANT
SUT PDS AB 2-0 CT2 27 (SUTURE) IMPLANT
SUT PROLENE 0 SH 30 (SUTURE) IMPLANT
SUT PROLENE 2 0 KS (SUTURE) ×2 IMPLANT
SUT SILK 2 0 (SUTURE)
SUT SILK 2 0 SH CR/8 (SUTURE) IMPLANT
SUT SILK 2-0 18XBRD TIE 12 (SUTURE) IMPLANT
SUT SILK 3 0 (SUTURE)
SUT SILK 3 0 SH CR/8 (SUTURE) IMPLANT
SUT SILK 3-0 18XBRD TIE 12 (SUTURE) IMPLANT
SUT VIC AB 0 CT1 36 (SUTURE) ×2 IMPLANT
SUT VICRYL 0 TIES 12 18 (SUTURE) IMPLANT
SUTURE DVC VL 180 2-0 12INGS21 (SUTURE) IMPLANT
SYR 20ML LL LF (SYRINGE) ×2 IMPLANT
SYR 30ML SLIP (SYRINGE) ×2 IMPLANT
TOWEL GREEN STERILE FF (TOWEL DISPOSABLE) ×2 IMPLANT
TRAY FOLEY MTR SLVR 16FR STAT (SET/KITS/TRAYS/PACK) ×2 IMPLANT
TRAY LAPAROSCOPIC MC (CUSTOM PROCEDURE TRAY) ×2 IMPLANT
TROCAR ADV FIXATION 5X100MM (TROCAR) ×2 IMPLANT
TROCAR XCEL NON-BLD 5MMX100MML (ENDOMECHANICALS) IMPLANT
TUBE CONNECTING 20X1/4 (TUBING) ×4 IMPLANT

## 2020-07-12 NOTE — Anesthesia Postprocedure Evaluation (Signed)
Anesthesia Post Note  Patient: Crystal Lewis  Procedure(s) Performed: XI ROBOT ASSISTED SIGMOID COLECTOMY (N/A Abdomen)     Patient location during evaluation: PACU Anesthesia Type: General Level of consciousness: awake and alert Pain management: pain level controlled Vital Signs Assessment: post-procedure vital signs reviewed and stable Respiratory status: spontaneous breathing, nonlabored ventilation and respiratory function stable Cardiovascular status: blood pressure returned to baseline and stable Postop Assessment: no apparent nausea or vomiting Anesthetic complications: no   No complications documented.  Last Vitals:  Vitals:   07/12/20 1335 07/12/20 1357  BP: (!) 106/52 (!) 109/52  Pulse: 70 62  Resp:    Temp:  (!) 36.3 C  SpO2:      Last Pain:  Vitals:   07/12/20 1357  PainSc: [name]

## 2020-07-12 NOTE — Transfer of Care (Signed)
Immediate Anesthesia Transfer of Care Note  Patient: Crystal Lewis  Procedure(s) Performed: XI ROBOT ASSISTED SIGMOID COLECTOMY (N/A Abdomen)  Patient Location: PACU  Anesthesia Type:General  Level of Consciousness: drowsy  Airway & Oxygen Therapy: Patient Spontanous Breathing and Patient connected to face mask oxygen  Post-op Assessment: Report given to RN and Post -op Vital signs reviewed and stable  Post vital signs: Reviewed and stable  Last Vitals:  Vitals Value Taken Time  BP 136/63   Temp    Pulse 62   Resp 14   SpO2 100%     Last Pain:  Vitals:   07/12/20 0815  PainSc: 0-No pain      Patients Stated Pain Goal: 3 (07/68/08 8110)  Complications: No complications documented.

## 2020-07-12 NOTE — Op Note (Addendum)
Robotic sigmoid colectomy  Indications: This patient presents with near obstructing sigmoid colon cancer.  She presented with bleeding and abdominal distention.    Pre-operative Diagnosis: carcinoma rectosigmoid colon   Post-operative Diagnosis: Same  Surgeon: Stark Klein   Assistants: Judyann Munson, RNFA  Anesthesia: General endotracheal anesthesia and Local anesthesia 1% plain lidocaine, 0.25.% bupivacaine, with epinephrine  ASA Class: 2  Procedure Details  The patient was seen in the Holding Room. The risks, benefits, complications, treatment options, and expected outcomes were discussed with the patient. The possibilities of reaction to medication, perforation of viscus, bleeding, recurrent infection, finding a normal colon, the need for additional procedures, failure to diagnose a condition, and creating a complication requiring transfusion or operation were discussed with the patient. The patient was advised of the risk of ostomy.  The patient concurred with the proposed plan, giving informed consent.   The patient was taken to the operating room, identified, and the procedure verified as robotic low anterior resection. A Time Out was held and the above information confirmed.  The patient was brought to the operating room and placed into the low lithotomy position.  After induction of a general anesthetic, a Foley catheter was inserted and the abdomen was prepped and draped in standard fashion. Local was administered at the left costal margin. The patient was placed into reverse trendelenberg position and rotated to the right.  The fascia was elevated and a Veress needle was used to access and insufflate the abdomen.  The fascia was continued to be elevated while a trocar was introduced into the abdomen.  Pneumoperitoneum was insufflated to a pressure of 15 mm Hg. The robotic scope/camera was introduced.    Exploration revealed a normal omentum, colon, small bowel, peritoneum, liver,  and stomach. Additional robotic trocars were then placed after anesthetizing the skin and peritoneum with local.  A 12 mm trocar was placed in the RLQ.  A 5 mm port was placed in the RUQ. The other 3 were 8 mm robotic ports.. The descending colon was then mobilized with gentle retraction of the colon in a medial direction with mobilization of the peritoneal reflection.   Mobilization of this area was complete to expose the retroperitoneum. The left ureter was identified during this mobilization process. There was no blood loss during this portion of the procedure.      The mobilization continued to take down the sigmoid from the abdominal wall. The tattoo was actually at the distal sigmoid, so the proximal rectum had to be mobilized.  The IMA/IMV pedicle was divided proximally.  The peritoneum was opened at the pelvic brim.  The lesion was identified, and the rectum was mobilized below the peritoneal reflection.  The rectal wall was cleaned off and two loads of the robotic stapler were used to divide the rectum.  The 12 mm port was opened approximately 4-5 cm to remove the specimen.  The skin was opened transversely, the fascia was opened vertically and the rectus was separated.   A wound protector was placed into the wound.  The colon was pulled out via the incision.   The pursestring device was used with a keith needle to place a stitch on the colon.  The colon was divided with the cautery.  The 29 mm stapler was selected.  The anvil was placed in the end of the descending colon. The pursestring was tied down and the fatty tissue was cleaned off the end of the colon.  The end of the colon was placed  back into the abdomen, and the cap was placed on the wound protector.  The abdomen was reinsufflated, and an end to end anastamosis was created.  The sizers were used to dilate the anus.  The 29 mm stapler was then advanced through the anus.  The vagina was checked.  The anvil was coupled with the stapler  laparoscopically.  The adjacent tissues were held out of the way while the stapler was closed.  The vagina was checked again to make sure there was no tethering of the posterior vaginal wall. Pressure was held, then the stapler was fired.  The stapler was gently removed, and the anastamotic rings were intact.  The proctoscope was used to examine the staple line, and no bleeding was seen.  The rectum was insufflated, and there were no bubbles seen coming up into the irrigant in the abdomen.  The irrigant was suctioned out of the pelvis.  A 4 quadrant inspection was performed and no blood or succus was visible in the abdomen.    The colon protocol was followed and new drapes were placed with a new cautery and suction. The extraction port was first addressed.  The peritoneum was closed with a 0-0 vicryl.  The fascial incision was then closed with a running #1 PDS suture.. The soft tissue was irrigated and the incisions were closed with 4-0 monocryl subcuticular closure. Steri-Strips were applied.  Instrument, sponge, and needle counts were correct prior to abdominal closure and at the conclusion of the case.   Findings: tumor ABOVE peritoneal reflection, but anastamosis is just below peritoneal reflection.   Estimated Blood Loss: less than 50 mL         Drains: none          Specimens: sigmoid colon, anastamotic rings            Complications: None; patient tolerated the procedure well.         Disposition: PACU - hemodynamically stable.         Condition: stable

## 2020-07-12 NOTE — Anesthesia Procedure Notes (Signed)
Procedure Name: Intubation Date/Time: 07/12/2020 9:38 AM Performed by: Bryson Corona, CRNA Pre-anesthesia Checklist: Patient identified, Emergency Drugs available, Suction available and Patient being monitored Patient Re-evaluated:Patient Re-evaluated prior to induction Oxygen Delivery Method: Circle System Utilized Preoxygenation: Pre-oxygenation with 100% oxygen Induction Type: IV induction Ventilation: Mask ventilation without difficulty Laryngoscope Size: Mac and 3 Grade View: Grade I Tube type: Oral Number of attempts: 1 Airway Equipment and Method: Stylet Placement Confirmation: ETT inserted through vocal cords under direct vision,  positive ETCO2 and breath sounds checked- equal and bilateral Secured at: 22 cm Tube secured with: Tape Dental Injury: Teeth and Oropharynx as per pre-operative assessment

## 2020-07-12 NOTE — Interval H&P Note (Signed)
History and Physical Interval Note:  07/12/2020 9:09 AM  Crystal Lewis  has presented today for surgery, with the diagnosis of SIGMOID COLON CANCER.  The various methods of treatment have been discussed with the patient and family. After consideration of risks, benefits and other options for treatment, the patient has consented to  Procedure(s): XI ROBOT ASSISTED SIGMOID COLECTOMY (N/A) as a surgical intervention.  The patient's history has been reviewed, patient examined, no change in status, stable for surgery.  I have reviewed the patient's chart and labs.  Questions were answered to the patient's satisfaction.     Stark Klein

## 2020-07-13 LAB — BASIC METABOLIC PANEL
Anion gap: 5 (ref 5–15)
BUN: 6 mg/dL — ABNORMAL LOW (ref 8–23)
CO2: 27 mmol/L (ref 22–32)
Calcium: 7.3 mg/dL — ABNORMAL LOW (ref 8.9–10.3)
Chloride: 97 mmol/L — ABNORMAL LOW (ref 98–111)
Creatinine, Ser: 0.87 mg/dL (ref 0.44–1.00)
GFR, Estimated: >60 mL/min (ref >60)
Glucose, Bld: 146 mg/dL — ABNORMAL HIGH (ref 70–99)
Potassium: 2.2 mmol/L — CL (ref 3.5–5.1)
Sodium: 129 mmol/L — ABNORMAL LOW (ref 135–145)

## 2020-07-13 LAB — CBC
HCT: 29.4 % — ABNORMAL LOW (ref 36.0–46.0)
Hemoglobin: 10.2 g/dL — ABNORMAL LOW (ref 12.0–15.0)
MCH: 31.1 pg (ref 26.0–34.0)
MCHC: 34.7 g/dL (ref 30.0–36.0)
MCV: 89.6 fL (ref 80.0–100.0)
Platelets: 216 10*3/uL (ref 150–400)
RBC: 3.28 MIL/uL — ABNORMAL LOW (ref 3.87–5.11)
RDW: 12.4 % (ref 11.5–15.5)
WBC: 9.9 10*3/uL (ref 4.0–10.5)
nRBC: 0 % (ref 0.0–0.2)

## 2020-07-13 LAB — MAGNESIUM: Magnesium: 1.4 mg/dL — ABNORMAL LOW (ref 1.7–2.4)

## 2020-07-13 LAB — POTASSIUM: Potassium: 3.2 mmol/L — ABNORMAL LOW (ref 3.5–5.1)

## 2020-07-13 LAB — PHOSPHORUS: Phosphorus: 2.5 mg/dL (ref 2.5–4.6)

## 2020-07-13 MED ORDER — LIOTHYRONINE SODIUM 5 MCG PO TABS
5.0000 ug | ORAL_TABLET | Freq: Every day | ORAL | Status: DC
  Administered 2020-07-13 – 2020-07-14 (×2): 5 ug via ORAL
  Filled 2020-07-13 (×2): qty 1

## 2020-07-13 MED ORDER — POTASSIUM CHLORIDE 10 MEQ/100ML IV SOLN
10.0000 meq | INTRAVENOUS | Status: DC
  Administered 2020-07-13 (×2): 10 meq via INTRAVENOUS
  Filled 2020-07-13 (×4): qty 100

## 2020-07-13 MED ORDER — POTASSIUM CHLORIDE CRYS ER 20 MEQ PO TBCR
40.0000 meq | EXTENDED_RELEASE_TABLET | Freq: Two times a day (BID) | ORAL | Status: DC
  Administered 2020-07-13 – 2020-07-15 (×5): 40 meq via ORAL
  Filled 2020-07-13 (×5): qty 2

## 2020-07-13 MED ORDER — LEVOTHYROXINE SODIUM 100 MCG PO TABS
100.0000 ug | ORAL_TABLET | Freq: Every day | ORAL | Status: DC
  Administered 2020-07-13 – 2020-07-14 (×2): 100 ug via ORAL
  Filled 2020-07-13 (×5): qty 1

## 2020-07-13 MED ORDER — POTASSIUM CHLORIDE 10 MEQ/100ML IV SOLN
10.0000 meq | INTRAVENOUS | Status: AC
  Administered 2020-07-13 (×2): 10 meq via INTRAVENOUS

## 2020-07-13 MED ORDER — MAGNESIUM SULFATE 2 GM/50ML IV SOLN
2.0000 g | Freq: Once | INTRAVENOUS | Status: AC
  Administered 2020-07-13: 2 g via INTRAVENOUS
  Filled 2020-07-13: qty 50

## 2020-07-13 NOTE — Progress Notes (Signed)
CRITICAL VALUE ALERT  Critical Value:  K=2.2    Date & Time Notied:  07/13/20; 3825  Provider Notified: Dr. Darene Lamer. Cornett  Orders Received/Actions taken: 4 runs of KCL IVPB.  Started 1st run at 0600.

## 2020-07-13 NOTE — Progress Notes (Signed)
1 Day Post-Op   Subjective/Chief Complaint: Biggest complaint is burning IV from potassium.  Passing gas already.    Objective: Vital signs in last 24 hours: Temp:  [96.9 F (36.1 C)-98.6 F (37 C)] 98.6 F (37 C) (10/13 1019) Pulse Rate:  [60-72] 70 (10/13 1019) Resp:  [14-18] 16 (10/13 1019) BP: (97-136)/(51-63) 111/52 (10/13 1019) SpO2:  [96 %-100 %] 97 % (10/13 1019) Weight:  [65 kg] 65 kg (10/13 0500) Last BM Date:  (pre-op)  Intake/Output from previous day: 10/12 0701 - 10/13 0700 In: 4329 [P.O.:360; I.V.:3716.9; IV Piggyback:252.2] Out: 905 [Urine:805; Blood:100] Intake/Output this shift: Total I/O In: 210 [P.O.:210] Out: -   General appearance: alert, cooperative and no distress Resp: breathing comfortably  Cardio: regular rate and rhythm ext warm, well perfused. abd some bruising at incisions.    Lab Results:  Recent Labs    07/12/20 1527 07/13/20 0414  WBC 15.4* 9.9  HGB 11.7* 10.2*  HCT 34.2* 29.4*  PLT 232 216   BMET Recent Labs    07/12/20 1527 07/13/20 0414  NA  --  129*  K  --  2.2*  CL  --  97*  CO2  --  27  GLUCOSE  --  146*  BUN  --  6*  CREATININE 1.05* 0.87  CALCIUM  --  7.3*   PT/INR No results for input(s): LABPROT, INR in the last 72 hours. ABG No results for input(s): PHART, HCO3 in the last 72 hours.  Invalid input(s): PCO2, PO2  Studies/Results: No results found.  Anti-infectives: Anti-infectives (From admission, onward)   Start     Dose/Rate Route Frequency Ordered Stop   07/12/20 2100  cefoTEtan (CEFOTAN) 2 g in sodium chloride 0.9 % 100 mL IVPB        2 g 200 mL/hr over 30 Minutes Intravenous Every 12 hours 07/12/20 1419 07/12/20 2133   07/12/20 0801  clindamycin (CLEOCIN) 900 MG/50ML IVPB       Note to Pharmacy: Laurita Quint   : cabinet override      07/12/20 0801 07/12/20 2014   07/12/20 0800  cefoTEtan (CEFOTAN) 2 g in sodium chloride 0.9 % 100 mL IVPB        2 g 200 mL/hr over 30 Minutes Intravenous On  call to O.R. 07/12/20 0745 07/12/20 0939   07/12/20 0800  clindamycin (CLEOCIN) 900 mg, gentamicin (GARAMYCIN) 240 mg in sodium chloride 0.9 % 1,000 mL for intraperitoneal lavage  Status:  Discontinued         Irrigation To Surgery 07/12/20 0745 07/12/20 1418      Assessment/Plan: s/p Procedure(s): XI ROBOT ASSISTED SIGMOID COLECTOMY (N/A) Advance diet  Entereg. Significant hypokalemia- getting IV and oral replacements. Recheck at 3 pm.    LOS: 1 day    Stark Klein 07/13/2020

## 2020-07-14 LAB — CBC
HCT: 31.9 % — ABNORMAL LOW (ref 36.0–46.0)
Hemoglobin: 10.8 g/dL — ABNORMAL LOW (ref 12.0–15.0)
MCH: 30.6 pg (ref 26.0–34.0)
MCHC: 33.9 g/dL (ref 30.0–36.0)
MCV: 90.4 fL (ref 80.0–100.0)
Platelets: 213 10*3/uL (ref 150–400)
RBC: 3.53 MIL/uL — ABNORMAL LOW (ref 3.87–5.11)
RDW: 12.9 % (ref 11.5–15.5)
WBC: 7.9 10*3/uL (ref 4.0–10.5)
nRBC: 0 % (ref 0.0–0.2)

## 2020-07-14 LAB — BASIC METABOLIC PANEL
Anion gap: 3 — ABNORMAL LOW (ref 5–15)
BUN: <5 mg/dL — ABNORMAL LOW (ref 8–23)
CO2: 29 mmol/L (ref 22–32)
Calcium: 7.9 mg/dL — ABNORMAL LOW (ref 8.9–10.3)
Chloride: 106 mmol/L (ref 98–111)
Creatinine, Ser: 0.76 mg/dL (ref 0.44–1.00)
GFR, Estimated: >60 mL/min (ref >60)
Glucose, Bld: 105 mg/dL — ABNORMAL HIGH (ref 70–99)
Potassium: 3.7 mmol/L (ref 3.5–5.1)
Sodium: 138 mmol/L (ref 135–145)

## 2020-07-14 MED ORDER — SODIUM CHLORIDE 0.9 % IV SOLN: 250.0000 mL | INTRAVENOUS | Status: DC | PRN

## 2020-07-14 MED ORDER — ACETAMINOPHEN 325 MG PO TABS: 650.0000 mg | ORAL_TABLET | Freq: Four times a day (QID) | ORAL | 1 refills | Status: DC | PRN

## 2020-07-14 MED ORDER — SODIUM CHLORIDE 0.9% FLUSH: 3.0000 mL | INTRAVENOUS | Status: DC | PRN

## 2020-07-14 MED ORDER — LIOTHYRONINE SODIUM 5 MCG PO TABS: 5.0000 ug | ORAL_TABLET | Freq: Every day | ORAL | Status: AC

## 2020-07-14 MED ORDER — POTASSIUM CHLORIDE CRYS ER 20 MEQ PO TBCR
40.0000 meq | EXTENDED_RELEASE_TABLET | Freq: Two times a day (BID) | ORAL | 0 refills | Status: DC
Start: 2020-07-14 — End: 2020-08-02

## 2020-07-14 MED ORDER — SODIUM CHLORIDE 0.9% FLUSH: 3.0000 mL | Freq: Two times a day (BID) | INTRAVENOUS | Status: DC

## 2020-07-14 MED ORDER — OXYCODONE HCL 5 MG PO TABS
5.0000 mg | ORAL_TABLET | ORAL | 0 refills | Status: DC | PRN
Start: 2020-07-14 — End: 2020-08-02

## 2020-07-14 NOTE — Discharge Instructions (Signed)
[facility], [place] [phone]  ABDOMINAL SURGERY: POST OP INSTRUCTIONS  Always review your discharge instruction sheet given to you by the facility where your surgery was performed.  IF YOU HAVE DISABILITY OR FAMILY LEAVE FORMS, YOU MUST BRING THEM TO THE OFFICE FOR PROCESSING.  PLEASE DO NOT GIVE THEM TO YOUR DOCTOR.  1. A prescription for pain medication may be given to you upon discharge.  Take your pain medication as prescribed, if needed.  If narcotic pain medicine is not needed, then you may take acetaminophen (Tylenol) or ibuprofen (Advil) as needed. 2. Take your usually prescribed medications unless otherwise directed. 3. If you need a refill on your pain medication, please contact your pharmacy. They will contact our office to request authorization.  Prescriptions will not be filled after 5pm or on week-ends. 4. You should follow a light diet the first few days after arrival home, such as soup and crackers, pudding, etc.unless your doctor has advised otherwise. A high-fiber, low fat diet can be resumed as tolerated.   Be sure to include lots of fluids daily. Most patients will experience some swelling and bruising on the chest and neck area.  Ice packs will help.  Swelling and bruising can take several days to resolve 5. Most patients will experience some swelling and bruising in the area of the incision. Ice pack will help. Swelling and bruising can take several days to resolve..  6. It is common to experience some constipation if taking pain medication after surgery.  Increasing fluid intake and taking a stool softener will usually help or prevent this problem from occurring.  A mild laxative (Milk of Magnesia or Miralax) should be taken according to package directions if there are no bowel movements after 48 hours. 7.  You may have steri-strips (small skin tapes) in place directly over the incision.  These strips should be left on the skin for 10-14 days.  If your  surgeon used skin glue on the incision, you may shower in 48 hours.  The glue will flake off over the next 2-3 weeks.  Any sutures or staples will be removed at the office during your follow-up visit. You may find that a light gauze bandage over your incision may keep your staples from being rubbed or pulled. You may shower and replace the bandage daily. 8. ACTIVITIES:  You may resume regular (light) daily activities beginning the next day--such as daily self-care, walking, climbing stairs--gradually increasing activities as tolerated.  You may have sexual intercourse when it is comfortable.  Refrain from any heavy lifting or straining until approved by your doctor. a. You may drive when you no longer are taking prescription pain medication, you can comfortably wear a seatbelt, and you can safely maneuver your car and apply brakes 9. Return to Work: __________n/a_____ 6. You should see your doctor in the office for a follow-up appointment approximately two weeks after your surgery.  Make sure that you call for this appointment within a day or two after you arrive home to insure a convenient appointment time. OTHER INSTRUCTIONS:  _____________________________________________________________ _____________________________________________________________  WHEN TO CALL YOUR DOCTOR: 1. Fever over 101.0 2. Inability to urinate 3. Nausea and/or vomiting 4. Extreme swelling or bruising 5. Continued bleeding from incision. 6. Increased pain, redness, or drainage from the incision. 7. Difficulty swallowing or breathing 8. Muscle cramping or spasms. 9. Numbness or tingling in hands or feet or around lips.  The clinic staff is available to answer  your questions during regular business hours.  Please don't hesitate to call and ask to speak to one of the nurses if you have concerns.  For further questions, please visit [email or web address]

## 2020-07-14 NOTE — Discharge Summary (Signed)
Physician Discharge Summary  Patient ID: Crystal Lewis MRN: [id number] DOB/AGE: 78-Jul-1942 78 y.o.  Admit date: 07/12/2020 Discharge date: 07/15/2020  Admission Diagnoses: Patient Active Problem List   Diagnosis Date Noted  . Cancer of sigmoid colon ([name]) 07/12/2020  . Thyroid disease   . Hypertension   . Low bone mass   . Atrophic vaginitis     Discharge Diagnoses:  Active Problems:   Cancer of sigmoid colon ([name]) and same as above  Discharged Condition: improved.    Hospital Course: Pt was admitted to the floor following robotic sigmoid colectomy 07/12/2020.  She did well and was passing gas on POD 1.  She had her diet advanced quickly.  She did have pretty significant hypokalemia, likely from full liq diet x 2 weeks and colon prep.  This was repleted IV and orally.  She was able to ambulate and was voiding spontaneously.  She was having some loose stools, but this was true pre op and she had been on full liquid diet for 2 weeks pre op due to partial large bowel obstruction.    She is discharged to home on POD 3 in stable condition.    Consults: None  Significant Diagnostic Studies: labs: K 4.3, Cr 0.74, HCT 32 prior to d/c.    Treatments: surgery: described above  Discharge Exam: Blood pressure 120/68, pulse 77, temperature 98.8 F (37.1 C), temperature source Oral, resp. rate 16, height 5\' 3"  (1.6 m), weight 66.2 kg, SpO2 98 %. General appearance: alert, cooperative and no distress Resp: breathing comfortably GI: soft, non distended, bruising at the extraction port and the RLQ port Extremities: extremities normal, atraumatic, no cyanosis or edema  Disposition:    Allergies as of 07/14/2020      Reactions   Adhesive [tape] Other (See Comments)   Leave on to long cause skin to peel off with tape   Sulfa Antibiotics Rash      Medication List    STOP taking these medications   IODINE (KELP) PO   metroNIDAZOLE 500 MG tablet Commonly known as: FLAGYL    neomycin 500 MG tablet Commonly known as: MYCIFRADIN     TAKE these medications   acetaminophen 325 MG tablet Commonly known as: TYLENOL Take 2 tablets (650 mg total) by mouth every 6 (six) hours as needed for mild pain, moderate pain, fever or headache (fever > 100.5).   atenolol-chlorthalidone 50-25 MG tablet Commonly known as: TENORETIC Take 1 tablet by mouth daily.   Biotin 5000 MCG Tabs Take 5,000 mcg by mouth daily.   buPROPion 150 MG 12 hr tablet Commonly known as: WELLBUTRIN SR Take 150 mg by mouth daily.   CALCIUM-D PO Take 1 tablet by mouth daily. Plus minerals   FIBER-CAPS PO Take 1 capsule by mouth in the morning, at noon, and at bedtime.   fluocinonide 0.05 % external solution Commonly known as: LIDEX Apply 1 application topically See admin instructions. 4 times a year   levothyroxine 100 MCG tablet Commonly known as: SYNTHROID Take 100 mcg by mouth daily. Must be the brand Name   liothyronine 5 MCG tablet Commonly known as: Cytomel Take 1 tablet (5 mcg total) by mouth daily. What changed: how much to take   metroNIDAZOLE 1 % gel Commonly known as: METROGEL Apply 1 application topically daily.   multivitamin capsule Take 1 capsule by mouth daily. Senior   NON FORMULARY Apply 1 application topically as needed (Hormones). Estriol/testosterone cream   OSTEO BI-FLEX ADV TRIPLE ST PO  Take 1 tablet by mouth in the morning and at bedtime.   oxyCODONE 5 MG immediate release tablet Commonly known as: Oxy IR/ROXICODONE Take 1 tablet (5 mg total) by mouth every 4 (four) hours as needed for moderate pain.   potassium chloride SA 20 MEQ tablet Commonly known as: KLOR-CON Take 2 tablets (40 mEq total) by mouth 2 (two) times daily.   Premarin vaginal cream Generic drug: conjugated estrogens Place 1 Applicatorful vaginally every 30 (thirty) days.   PRESERVISION AREDS PO Take 1 capsule by mouth in the morning and at bedtime.   raloxifene 60 MG  tablet Commonly known as: EVISTA Take 1 tablet (60 mg total) by mouth daily.       Follow-up Information    Stark Klein, MD Follow up in 2 week(s).   Specialty: General Surgery Contact information: [address] [place] [address] [phone]               Signed: Stark Klein 07/14/2020, 12:14 PM

## 2020-07-14 NOTE — Plan of Care (Signed)
  Problem: Activity: Goal: Risk for activity intolerance will decrease Outcome: Progressing   Problem: Nutrition: Goal: Adequate nutrition will be maintained Outcome: Progressing   Problem: Elimination: Goal: Will not experience complications related to bowel motility Outcome: Progressing   Problem: Pain Managment: Goal: General experience of comfort will improve Outcome: Progressing   

## 2020-07-14 NOTE — Progress Notes (Signed)
2 Days Post-Op   Subjective/Chief Complaint: Has had at least 2 BMs.  Extraction port is sore.  No n/v.    Objective: Vital signs in last 24 hours: Temp:  [98.1 F (36.7 C)-98.8 F (37.1 C)] 98.8 F (37.1 C) (10/14 0537) Pulse Rate:  [77-78] 77 (10/14 0537) Resp:  [16] 16 (10/14 0537) BP: (113-120)/(68-98) 120/68 (10/14 0537) SpO2:  [98 %-100 %] 98 % (10/14 0537) Weight:  [66.2 kg] 66.2 kg (10/14 0500) Last BM Date: 07/13/20  Intake/Output from previous day: 10/13 0701 - 10/14 0700 In: 1884.8 [P.O.:990; I.V.:894.8] Out: -  Intake/Output this shift: No intake/output data recorded.  General appearance: alert, cooperative and no distress Resp: breathing comfortably  Cardio: regular rate and rhythm ext warm, well perfused. abd some bruising at incisions.    Lab Results:  Recent Labs    07/13/20 0414 07/14/20 0354  WBC 9.9 7.9  HGB 10.2* 10.8*  HCT 29.4* 31.9*  PLT 216 213   BMET Recent Labs    07/13/20 0414 07/13/20 0414 07/13/20 1455 07/14/20 0354  NA 129*  --   --  138  K 2.2*   < > 3.2* 3.7  CL 97*  --   --  106  CO2 27  --   --  29  GLUCOSE 146*  --   --  105*  BUN 6*  --   --  <5*  CREATININE 0.87  --   --  0.76  CALCIUM 7.3*  --   --  7.9*   < > = values in this interval not displayed.   PT/INR No results for input(s): LABPROT, INR in the last 72 hours. ABG No results for input(s): PHART, HCO3 in the last 72 hours.  Invalid input(s): PCO2, PO2  Studies/Results: No results found.  Anti-infectives: Anti-infectives (From admission, onward)   Start     Dose/Rate Route Frequency Ordered Stop   07/12/20 2100  cefoTEtan (CEFOTAN) 2 g in sodium chloride 0.9 % 100 mL IVPB        2 g 200 mL/hr over 30 Minutes Intravenous Every 12 hours 07/12/20 1419 07/12/20 2133   07/12/20 0801  clindamycin (CLEOCIN) 900 MG/50ML IVPB       Note to Pharmacy: Laurita Quint   : cabinet override      07/12/20 0801 07/12/20 2014   07/12/20 0800  cefoTEtan (CEFOTAN) 2 g  in sodium chloride 0.9 % 100 mL IVPB        2 g 200 mL/hr over 30 Minutes Intravenous On call to O.R. 07/12/20 0745 07/12/20 0939   07/12/20 0800  clindamycin (CLEOCIN) 900 mg, gentamicin (GARAMYCIN) 240 mg in sodium chloride 0.9 % 1,000 mL for intraperitoneal lavage  Status:  Discontinued         Irrigation To Surgery 07/12/20 0745 07/12/20 1418      Assessment/Plan: s/p Procedure(s): XI ROBOT ASSISTED SIGMOID COLECTOMY (N/A)  D/c entereg Soft diet. Pain control Ambulate/pulmonary toilet . Home tomorrow.     LOS: 2 days    Stark Klein 07/14/2020

## 2020-07-15 LAB — CBC
HCT: 32.8 % — ABNORMAL LOW (ref 36.0–46.0)
Hemoglobin: 10.8 g/dL — ABNORMAL LOW (ref 12.0–15.0)
MCH: 30.5 pg (ref 26.0–34.0)
MCHC: 32.9 g/dL (ref 30.0–36.0)
MCV: 92.7 fL (ref 80.0–100.0)
Platelets: 221 10*3/uL (ref 150–400)
RBC: 3.54 MIL/uL — ABNORMAL LOW (ref 3.87–5.11)
RDW: 13.2 % (ref 11.5–15.5)
WBC: 7.4 10*3/uL (ref 4.0–10.5)
nRBC: 0 % (ref 0.0–0.2)

## 2020-07-15 LAB — BASIC METABOLIC PANEL
Anion gap: 5 (ref 5–15)
BUN: 7 mg/dL — ABNORMAL LOW (ref 8–23)
CO2: 28 mmol/L (ref 22–32)
Calcium: 7.9 mg/dL — ABNORMAL LOW (ref 8.9–10.3)
Chloride: 104 mmol/L (ref 98–111)
Creatinine, Ser: 0.74 mg/dL (ref 0.44–1.00)
GFR, Estimated: >60 mL/min (ref >60)
Glucose, Bld: 101 mg/dL — ABNORMAL HIGH (ref 70–99)
Potassium: 4.3 mmol/L (ref 3.5–5.1)
Sodium: 137 mmol/L (ref 135–145)

## 2020-07-15 NOTE — Care Management Important Message (Signed)
Important Message  Patient Details  Name: Crystal Lewis MRN: [id number] Date of Birth: 1941-03-29   Medicare Important Message Given:  Yes     Victory Dresden Montine Circle 07/15/2020, 3:41 PM

## 2020-07-20 LAB — SURGICAL PATHOLOGY

## 2020-07-30 ENCOUNTER — Telehealth: Payer: Self-pay | Admitting: Surgery

## 2020-07-30 NOTE — Telephone Encounter (Signed)
Patient called with concerns about constipation. She had a sigmoid colectomy on 10/12. Reports she had been having bowel movements regularly until yesterday. Has not had a BM today, feels constipated and is concerned. No nausea or vomiting and is otherwise doing well. She is currently taking senna. I advised her to begin daily miralax today and to use a suppository. If no bowel movement by tomorrow she can also try milk of magnesia. She says she already has clinic follow up scheduled this week.  Michaelle Birks, [facility], Hepatobiliary and Pancreatic Surgery 07/30/20 12:35 PM

## 2020-08-02 ENCOUNTER — Other Ambulatory Visit: Payer: Self-pay | Admitting: Oncology

## 2020-08-02 ENCOUNTER — Other Ambulatory Visit: Payer: Self-pay

## 2020-08-02 ENCOUNTER — Inpatient Hospital Stay: Payer: Medicare PPO | Attending: Oncology | Admitting: Oncology

## 2020-08-02 ENCOUNTER — Encounter: Payer: Medicare PPO | Admitting: Genetic Counselor

## 2020-08-02 VITALS — BP 139/73 | HR 86 | Temp 98.4°F | Resp 18 | Ht 63.0 in | Wt 129.5 lb

## 2020-08-02 DIAGNOSIS — M818 Other osteoporosis without current pathological fracture: Secondary | ICD-10-CM

## 2020-08-02 DIAGNOSIS — F32A Depression, unspecified: Secondary | ICD-10-CM

## 2020-08-02 DIAGNOSIS — Z803 Family history of malignant neoplasm of breast: Secondary | ICD-10-CM

## 2020-08-02 DIAGNOSIS — C187 Malignant neoplasm of sigmoid colon: Secondary | ICD-10-CM | POA: Diagnosis not present

## 2020-08-02 DIAGNOSIS — I10 Essential (primary) hypertension: Secondary | ICD-10-CM

## 2020-08-02 DIAGNOSIS — C7989 Secondary malignant neoplasm of other specified sites: Secondary | ICD-10-CM

## 2020-08-02 DIAGNOSIS — Z800 Family history of malignant neoplasm of digestive organs: Secondary | ICD-10-CM

## 2020-08-02 DIAGNOSIS — E039 Hypothyroidism, unspecified: Secondary | ICD-10-CM | POA: Diagnosis not present

## 2020-08-02 MED ORDER — CAPECITABINE 500 MG PO TABS: ORAL_TABLET | ORAL | 0 refills | Status: DC

## 2020-08-02 NOTE — Progress Notes (Addendum)
[facility] Patient Consult   Requesting MD: Ceria Suminski 79 y.o.  06-14-1941    Reason for Consult: Colon cancer   HPI: Ms. Jenny Reichmann developed rectal bleeding over several days.  She saw her primary provider and was referred to gastroenterology.  She underwent a colonoscopy on 06/10/2020.  A mass was found in the sigmoid colon with a biopsy confirming invasive well differentiated adenocarcinoma. A CT of the abdomen and pelvis on 06/20/2020 revealed abnormal distal sigmoid wall thickening.  No obstruction.  5 and 3 mm nodes mm noted in the sigmoid mesocolon.  Well-defined low-density lesions were noted in segments 2 and 3 of the liver with the largest measuring 19 mm.  An MRI of the abdomen on 06/22/2020 revealed a 1.9 cm left hepatic and multiple smaller lesions likely representing benign cyst.  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 Barry Dienes and was taken to the operating room for a robotic sigmoid colectomy on 07/12/2020.  Tumor was noted to be above the peritoneal reflection with an anastomosis below the peritoneal reflection. The pathology revealed an invasive adenocarcinoma of the distal sigmoid colon.  Tumor invades through the muscularis propria into pericolorectal tissues.  Lymphovascular invasion is present.  No tumor deposits.  Resection margins are negative.  1/12 lymph nodes positive.  No perineural invasion.  Mismatch repair protein expression returned intact.  She reports an uneventful operative recovery.  She is eating and having bowel movements.  Past Medical History:  Diagnosis Date  . Allergy   . Atrophic vaginitis   . Depression   . Hypertension   . Hypothyroidism   . Low bone mass   . Osteoporosis   . Thyroid disease    Hypothyroid    .  Seborrheic dermatitis   .  Rosacea   .  G1, P1   .  Colon cancer, sigmoid, T3N1                                                                                             October 2021  Past Surgical History:  Procedure Laterality Date  . COSMETIC SURGERY    . FOOT SURGERY    . TONSILLECTOMY    . TUBAL LIGATION      Medications: Reviewed  Allergies:  Allergies  Allergen Reactions  . Adhesive [Tape] Other (See Comments)    Leave on to long cause skin to peel off with tape  . Sulfa Antibiotics Rash    Family history: Her mother had breast cancer twice, and maternal aunt and maternal great aunt had breast cancer.  Maternal cousins have a history of breast cancer including a maternal female cousin diagnosed with breast cancer at age 54.  A maternal aunt died of colon cancer at age 56.  She reports family members have undergone negative genetic testing, specifically BRCA1 testing  Social History:   She lives in [place].  She is retired Radio producer.  She does not use cigarettes.  She has 1/2 glass of wine every few weeks.  No transfusion history.  No risk factor for HIV or hepatitis.  ROS:   Positives include: Blood in the stool for a few days prior to seeking medical attention, rash over the abdomen after taking presurgery antibiotics, thrush following surgery-improved with fluconazole  A complete ROS was otherwise negative.  Physical Exam:  Blood pressure 139/73, pulse 86, temperature 98.4 F (36.9 C), temperature source Tympanic, resp. rate 18, height _0  (1.6 m), weight 129 lb 8 oz (58.7 kg), SpO2 100 %.  HEENT: Mild white coat over the tongue, no ulcers or buccal thrush Lungs: Clear bilaterally Cardiac: Regular rate and rhythm Abdomen: No hepatosplenomegaly, no mass, nontender, healed surgical incisions  Vascular: No leg edema Lymph nodes: No cervical, supraclavicular, axillary, or inguinal nodes Neurologic: Alert and oriented, motor exam appears intact in the upper and lower extremities bilaterally Skin: No rash Musculoskeletal: No spine tenderness   LAB:  CBC  Lab Results   Component Value Date   WBC 7.4 07/15/2020   HGB 10.8 (L) 07/15/2020   HCT 32.8 (L) 07/15/2020   MCV 92.7 07/15/2020   PLT 221 07/15/2020   NEUTROABS 6.2 07/07/2020        CMP  Lab Results  Component Value Date   NA 137 07/15/2020   K 4.3 07/15/2020   CL 104 07/15/2020   CO2 28 07/15/2020   GLUCOSE 101 (H) 07/15/2020   BUN 7 (L) 07/15/2020   CREATININE 0.74 07/15/2020   CALCIUM 7.9 (L) 07/15/2020   PROT 6.6 07/07/2020   ALBUMIN 3.3 (L) 07/07/2020   AST 21 07/07/2020   ALT 14 07/07/2020   ALKPHOS 54 07/07/2020   BILITOT 0.6 07/07/2020   GFRNONAA >60 07/15/2020     Lab Results  Component Value Date   CEA1 1.6 07/07/2020    Imaging:  As per HPI, CT images from 06/20/2020 reviewed   Assessment/Plan:   1. Colon cancer, sigmoid, stage IIIb (T3N1), status post a robotic sigmoid colectomy 07/12/2020          Colonoscopy 06/10/2020-sigmoid colon mass-invasive well differentiated adenocarcinoma, mismatch repair protein expression intact, incomplete colonoscopy?  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 Hypertension 3. Family history of breast and colon cancer 4. Depression 5. Hypothyroidism 6. Oral candidiasis following surgery October 2021   Disposition:   Ms. Umholtz has been diagnosed with stage III colon cancer.  She underwent a sigmoid colectomy.  She has recovered from surgery.  I discussed  the prognosis and adjuvant treatment options with her.  We reviewed details of the surgical pathology report.  She has a good prognosis for a long-term disease-free survival.  We discussed the benefit associated with 5-fluorouracil-based adjuvant systemic therapy in patients with resected stage III colon cancer.  We also discussed the small absolute benefit associated with the addition of oxaliplatin.  I explained oxaliplatin does not clearly add additional benefit in older patients.  We reviewed the neuropathy associated with oxaliplatin.  She does not wish to receive oxaliplatin.  I  recommend adjuvant capecitabine.  We reviewed potential toxicities associated with capecitabine including the chance for nausea, mucositis, alopecia, cardiac toxicity, diarrhea, and hematologic toxicity.  We discussed the rash, sun sensitivity, hyperpigmentation, and hand/foot syndrome associated with capecitabine.  She agrees to proceed.  The plan is to begin adjuvant capecitabine on 08/15/2020.  She will return for an office and lab visit on 09/01/2020.  She does not appear to have hereditary nonpolyposis colon cancer syndrome but her family members are at increased risk of developing colorectal cancer and should receive appropriate screening.  She has an extensive family history of breast cancer including female breast cancer.  There is also a family history of colon cancer.  She will be referred to the genetics counselor.  Betsy Coder, MD  08/02/2020, 2:30 PM

## 2020-08-03 ENCOUNTER — Telehealth: Payer: Self-pay

## 2020-08-03 ENCOUNTER — Telehealth: Payer: Self-pay | Admitting: Oncology

## 2020-08-03 ENCOUNTER — Telehealth: Payer: Self-pay | Admitting: Pharmacist

## 2020-08-03 ENCOUNTER — Ambulatory Visit
Admission: RE | Admit: 2020-08-03 | Discharge: 2020-08-03 | Disposition: A | Discharge status: Home / Self Care | Payer: Medicare PPO | Source: Ambulatory Visit | Attending: Oncology | Admitting: Oncology

## 2020-08-03 DIAGNOSIS — C187 Malignant neoplasm of sigmoid colon: Secondary | ICD-10-CM

## 2020-08-03 MED ORDER — CAPECITABINE 500 MG PO TABS: ORAL_TABLET | ORAL | 0 refills | Status: DC

## 2020-08-03 NOTE — Progress Notes (Signed)
Informed patient Dr. Benay Spice has ordered a CT of Chest to be done to complete staging as this has not been done previously.  It will be scheduled once prior approval is obtained.  She verbalized an understanding.

## 2020-08-03 NOTE — Telephone Encounter (Signed)
Oral Oncology Patient Advocate Encounter  Prior Authorization for Xeloda has been approved.    PA# [id number] Effective dates: 11/3/2 through 08/03/21  Patients co-pay is $50  Oral Oncology Clinic will continue to follow.   Crystal Lewis Patient [place] Phone [phone] Fax [phone] 08/03/2020 11:24 AM

## 2020-08-03 NOTE — Telephone Encounter (Signed)
Oral Oncology Patient Advocate Encounter  Received notification from [facility] that prior authorization for Xeloda is required.  PA submitted on CoverMyMeds Key [id number] Status is pending  Oral Oncology Clinic will continue to follow.  [place] Patient [place] Phone [phone] Fax [phone] 08/03/2020 8:30 AM

## 2020-08-03 NOTE — Telephone Encounter (Signed)
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  Prescription dose and frequency assessed for appropriateness. Appropriate for therapy initiation.   CBC and BMP from 07/15/2020 assessed as well as CMP and CBC w/ Diff from 07/07/2020 labs OK for treatment initiation.  Current medication list in Epic reviewed, DDIs with Xeloda identified:  Category B DDI between Xeloda and calcium carbonate-vitamin D - will discuss with patient to separate administration of supplement with Xeloda   Given patient's medication list has multiple OTC vitamin supplements, will review with patient's ingredients for these products with goal of avoiding folic acid due to folic acid's ability to increase side effects of Xeloda.   Evaluated chart and no patient barriers to medication adherence noted.   Prescription has been e-scribed to the [facility] for benefits analysis and approval.  Oral Oncology Clinic will continue to follow for insurance authorization, copayment issues, initial counseling and start date.  Leron Croak, PharmD, BCPS Hematology/Oncology Clinical Pharmacist [facility] [phone] 08/03/2020 8:30 AM

## 2020-08-03 NOTE — Telephone Encounter (Signed)
Scheduled appointments per 11/2 los. Spoke to patient who is aware of appointments date and time.

## 2020-08-03 NOTE — Progress Notes (Signed)
Met with patient and a female friend that accompanied her today to her initial medical oncology consult with Dr. Benay Spice s/p colon surgery.  I had previously spoken to the patient but reiterated my role as nurse navigator.  She was given my card with my direct contact information.  I provided the patient with printed information on Xeloda and she verbalized an understanding of the plan of care discussed by Dr. Benay Spice today.

## 2020-08-04 ENCOUNTER — Encounter: Payer: Self-pay | Admitting: General Practice

## 2020-08-04 NOTE — Progress Notes (Signed)
[place] Psychosocial Distress Screening Clinical Social Work  Clinical Social Work was referred by distress screening protocol.  The patient scored a 6 on the Psychosocial Distress Thermometer which indicates moderate distress. Clinical Social Worker contacted patient by phone to assess for distress and other psychosocial needs.   "The only time I was scared was the first two days of surgery."  Was helped in immediate recovery period by sister who is a retired Therapist, sports at [employer].  "She must have answered 100 questions for me.  Has had a lot of support from family, friends, neighbors.    She is more concerned about tolerating chemotherapy, including side effects.  Is concerned about "not knowing what will happen to you" and cannot make a plan.  She is living by herself.  "This is scary."  Lives in retirement community where she has access to clinical nurses.  Needs to find out what they can do for her.    Has not eaten for six weeks prior to surgery - "I have forgotten how to eat..."  "I know what an anorexic feels like."  Lives in retirement community where meals are provided, will work w chef to find foods she can tolerate.   "They will fix anything I want and can deliver it."  Food became a "source of fear for me."    Has been fairly isolated during COVID - could not visit family/friends. Feels like chemotherapy will extend her social isolation due to need to be careful of infection risk.  Church is "semi open", is now back to [employer].     ONCBCN DISTRESS SCREENING 08/02/2020  Screening Type Initial Screening  Distress experienced in past week (1-10) 6  Emotional problem type Adjusting to appearance changes;Adjusting to illness;Isolation/feeling alone  Physical Problem type Loss of appetitie  Physician notified of physical symptoms Yes  Referral to clinical psychology No  Referral to clinical social work Yes  Referral to dietition Yes  Referral to financial advocate No  Referral to  support programs No  Referral to palliative care No    Clinical Social Worker follow up needed: No.  Email sent with resources and my contact information, she is encouraged to reach out as needed for help/support.    If yes, follow up plan:  Beverely Pace, LCSW   Edwyna Shell, [place] Clinical Social Worker  Edwyna Shell, [place] Social Worker Phone:  [phone]  Phone:  [phone]

## 2020-08-08 ENCOUNTER — Other Ambulatory Visit: Payer: Self-pay | Admitting: *Deleted

## 2020-08-08 MED ORDER — ONDANSETRON HCL 8 MG PO TABS: 8.0000 mg | ORAL_TABLET | Freq: Two times a day (BID) | ORAL | 0 refills | Status: DC | PRN

## 2020-08-08 MED ORDER — PROCHLORPERAZINE MALEATE 5 MG PO TABS: 5.0000 mg | ORAL_TABLET | Freq: Four times a day (QID) | ORAL | 0 refills | Status: DC | PRN

## 2020-08-08 MED FILL — CAPECITABINE 500 MG TABLET: 500 | 14 days supply | Qty: 84 | Fill #0

## 2020-08-08 NOTE — Telephone Encounter (Signed)
Oral Chemotherapy Pharmacist Encounter   Attempted to reach patient to provide update and offer for initial counseling on oral medication: Xeloda (capecitabine).   Patient unable to talk at this time - will call call back to discuss details of medication acquisition and initial counseling session.  Leron Croak, PharmD, BCPS Hematology/Oncology Clinical Pharmacist [facility] [phone] 08/08/2020 10:13 AM

## 2020-08-08 NOTE — Telephone Encounter (Signed)
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  Counseled patient on administration, dosing, side effects, monitoring, drug-food interactions, safe handling, storage, and disposal.  Patient will take Xeloda 500mg  tablets, 3 tablets (1500mg ) by mouth in AM and 3 tabs (1500mg ) by mouth in PM, within 30 minutes of finishing meals, for 14 days on, 7 days off, repeated every 21 days.  Xeloda start date: 08/15/20  Adverse effects include but are not limited to: fatigue, decreased blood counts, GI upset, diarrhea, mouth sores, and hand-foot syndrome.  Patient requesting anti-emetic on hand to take if nausea develops - MD notified.  Patient will obtain anti diarrheal and alert the office of 4 or more loose stools above baseline.  Reviewed with patient importance of keeping a medication schedule and plan for any missed doses. No barriers to medication adherence identified.  Medication reconciliation performed and medication/allergy list updated. Patient stated she is not currently taking any vitamins with folic acid.   Patient stated she will pick up Xeloda from [facility] prior to starting therapy on 08/15/20.  Patient informed the pharmacy will reach out 5-7 days prior to needing next fill of Xeloda to coordinate continued medication acquisition to prevent break in therapy.  All questions answered.  Crystal Lewis voiced understanding and appreciation.   Medication education handout placed in mail for patient. Patient knows to call the office with questions or concerns. Oral Chemotherapy Clinic phone number provided to patient.   Leron Croak, PharmD, BCPS Hematology/Oncology Clinical Pharmacist [facility] [phone] 08/08/2020 2:59 PM

## 2020-08-10 ENCOUNTER — Other Ambulatory Visit: Payer: Self-pay

## 2020-08-16 ENCOUNTER — Telehealth: Payer: Self-pay | Admitting: Oncology

## 2020-08-16 NOTE — Telephone Encounter (Signed)
Scheduled appointment per 11/16 secure chat with RN Manuela Schwartz. Spoke to patient who is aware of appointment date and time.

## 2020-08-22 ENCOUNTER — Other Ambulatory Visit: Payer: Self-pay | Admitting: Oncology

## 2020-08-22 DIAGNOSIS — C187 Malignant neoplasm of sigmoid colon: Secondary | ICD-10-CM

## 2020-08-26 NOTE — Telephone Encounter (Signed)
Dr. Benay Spice,  For your approval or refusal.  I am only sending you this one as it is 31 days old.  Thanks! Threasa Beards

## 2020-08-29 ENCOUNTER — Telehealth: Payer: Self-pay

## 2020-08-29 NOTE — Telephone Encounter (Signed)
Patient calls wanting to know if it is okay for her to use Miralax.  She states she isn't what she considers constipated but since having part of her colon removed she feels like the stool just sits in the rectum.  She is hoping the Miralax will help.  I advised her that it is fine for her take Miralax.  She verbalized an understanding.

## 2020-08-31 ENCOUNTER — Inpatient Hospital Stay: Payer: Medicare PPO | Attending: Nurse Practitioner

## 2020-08-31 ENCOUNTER — Encounter: Payer: Self-pay | Admitting: Nurse Practitioner

## 2020-08-31 ENCOUNTER — Telehealth: Payer: Self-pay | Admitting: Nurse Practitioner

## 2020-08-31 ENCOUNTER — Other Ambulatory Visit: Payer: Self-pay

## 2020-08-31 ENCOUNTER — Inpatient Hospital Stay: Payer: Medicare PPO | Admitting: Nurse Practitioner

## 2020-08-31 ENCOUNTER — Other Ambulatory Visit: Payer: Self-pay | Admitting: Oncology

## 2020-08-31 VITALS — BP 119/66 | HR 75 | Temp 97.8°F | Resp 15 | Ht 63.0 in | Wt 133.1 lb

## 2020-08-31 DIAGNOSIS — N390 Urinary tract infection, site not specified: Secondary | ICD-10-CM | POA: Insufficient documentation

## 2020-08-31 DIAGNOSIS — C187 Malignant neoplasm of sigmoid colon: Secondary | ICD-10-CM

## 2020-08-31 DIAGNOSIS — E039 Hypothyroidism, unspecified: Secondary | ICD-10-CM | POA: Diagnosis not present

## 2020-08-31 DIAGNOSIS — F32A Depression, unspecified: Secondary | ICD-10-CM | POA: Insufficient documentation

## 2020-08-31 DIAGNOSIS — I10 Essential (primary) hypertension: Secondary | ICD-10-CM | POA: Insufficient documentation

## 2020-08-31 LAB — CBC WITH DIFFERENTIAL (CANCER CENTER ONLY)
Abs Immature Granulocytes: 0.03 10*3/uL (ref 0.00–0.07)
Basophils Absolute: 0 10*3/uL (ref 0.0–0.1)
Basophils Relative: 1 %
Eosinophils Absolute: 0.2 10*3/uL (ref 0.0–0.5)
Eosinophils Relative: 2 %
HCT: 37 % (ref 36.0–46.0)
Hemoglobin: 12.4 g/dL (ref 12.0–15.0)
Immature Granulocytes: 0 %
Lymphocytes Relative: 29 %
Lymphs Abs: 2.5 10*3/uL (ref 0.7–4.0)
MCH: 31.7 pg (ref 26.0–34.0)
MCHC: 33.5 g/dL (ref 30.0–36.0)
MCV: 94.6 fL (ref 80.0–100.0)
Monocytes Absolute: 0.8 10*3/uL (ref 0.1–1.0)
Monocytes Relative: 9 %
Neutro Abs: 5.1 10*3/uL (ref 1.7–7.7)
Neutrophils Relative %: 59 %
Platelet Count: 326 10*3/uL (ref 150–400)
RBC: 3.91 MIL/uL (ref 3.87–5.11)
RDW: 13.8 % (ref 11.5–15.5)
WBC Count: 8.6 10*3/uL (ref 4.0–10.5)
nRBC: 0 % (ref 0.0–0.2)

## 2020-08-31 LAB — CMP (CANCER CENTER ONLY)
ALT: 8 U/L (ref 0–44)
AST: 15 U/L (ref 15–41)
Albumin: 3.4 g/dL — ABNORMAL LOW (ref 3.5–5.0)
Alkaline Phosphatase: 56 U/L (ref 38–126)
Anion gap: 8 (ref 5–15)
BUN: 20 mg/dL (ref 8–23)
CO2: 31 mmol/L (ref 22–32)
Calcium: 8.7 mg/dL — ABNORMAL LOW (ref 8.9–10.3)
Chloride: 101 mmol/L (ref 98–111)
Creatinine: 1.02 mg/dL — ABNORMAL HIGH (ref 0.44–1.00)
GFR, Estimated: 56 mL/min — ABNORMAL LOW (ref >60)
Glucose, Bld: 129 mg/dL — ABNORMAL HIGH (ref 70–99)
Potassium: 3.1 mmol/L — ABNORMAL LOW (ref 3.5–5.1)
Sodium: 140 mmol/L (ref 135–145)
Total Bilirubin: 0.9 mg/dL (ref 0.3–1.2)
Total Protein: 6.5 g/dL (ref 6.5–8.1)

## 2020-08-31 MED ORDER — POTASSIUM CHLORIDE CRYS ER 20 MEQ PO TBCR: 20.0000 meq | EXTENDED_RELEASE_TABLET | Freq: Every day | ORAL | 0 refills | Status: DC

## 2020-08-31 MED FILL — CAPECITABINE 500 MG TABLET: 500 | 14 days supply | Qty: 84 | Fill #0

## 2020-08-31 NOTE — Telephone Encounter (Signed)
xeloda refill request

## 2020-08-31 NOTE — Telephone Encounter (Signed)
Scheduled per los. Gave avs and calendar  

## 2020-08-31 NOTE — Progress Notes (Signed)
  [place] OFFICE PROGRESS NOTE   Diagnosis: Colon cancer  INTERVAL HISTORY:   Crystal Lewis returns as scheduled.  She completed cycle 1 adjuvant Xeloda beginning 08/15/2020.  She denies nausea/vomiting.  No diarrhea.  No hand or foot pain or redness.  At nighttime she noted that the lips became "pinker and swollen".  Tenderness as well.  No ulcers within the oral cavity.  Main complaint is an alteration in taste and skin changes.  Objective:  Vital signs in last 24 hours:  Blood pressure 119/66, pulse 75, temperature 97.8 F (36.6 C), temperature source Tympanic, resp. rate 15, height $RemoveBe'5\' 3"'hVrnJhToU$  (1.6 m), weight 133 lb 1.6 oz (60.4 kg), SpO2 100 %.    HEENT: No thrush or ulcers.  Lips have a normal appearance. Resp: Lungs clear bilaterally. Cardio: Regular rate and rhythm. GI: Abdomen soft and nontender.  No hepatomegaly. Vascular: No leg edema. Skin: Palms without erythema.  Scattered small pink and hyperpigmented skin lesions face and forearms.   Lab Results:  Lab Results  Component Value Date   WBC 8.6 08/31/2020   HGB 12.4 08/31/2020   HCT 37.0 08/31/2020   MCV 94.6 08/31/2020   PLT 326 08/31/2020   NEUTROABS 5.1 08/31/2020    Imaging:  No results found.  Medications: I have reviewed the patient's current medications.  Assessment/Plan: 1. Colon cancer, sigmoid, stage IIIb (T3N1), status post a robotic sigmoid colectomy 07/12/2020          Colonoscopy 06/10/2020-sigmoid colon mass-invasive well differentiated adenocarcinoma, mismatch repair protein expression intact, incomplete colonoscopy?  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 1/12 lymph nodes, lymphovascular invasion present ? Intact mismatch repair protein expression ? Tumor at the distal sigmoid colon proximal to the peritoneal reflection ? Cycle 1 adjuvant Xeloda 08/15/2020 ? Cycle 2 adjuvant Xeloda 09/05/2020 2. Hypertension 3. Family history of breast and colon cancer 4. Depression 5. Hypothyroidism 6. Oral candidiasis following surgery October 2021  Disposition: Crystal Lewis appears stable.  She has completed 1 cycle of adjuvant Xeloda.  Plan to proceed with cycle 2 as scheduled beginning 09/05/2020.  We discussed the skin changes she has noticed are most likely secondary to Xeloda.  The lip soreness may also have been related to Xeloda but it is unusual for this to only occur at nighttime.  If this recurs during cycle 2 she will stop Xeloda and contact the office.  We reviewed the CBC and chemistry panel from today.  Labs adequate to proceed with Xeloda as above.  She has hypokalemia.  She is on a combination blood pressure medication with chlorthalidone.  Prescription sent to her pharmacy for K-Dur 20 mEq daily.  We will repeat labs at her next visit.  She will return for lab and follow-up in 3 weeks.  She will contact the office in the interim as outlined above or with any other problems.    Ned Card ANP/GNP-BC   08/31/2020  2:17 PM

## 2020-09-01 ENCOUNTER — Inpatient Hospital Stay: Payer: Medicare PPO | Admitting: Nutrition

## 2020-09-01 ENCOUNTER — Encounter: Payer: Self-pay | Admitting: Nutrition

## 2020-09-01 ENCOUNTER — Other Ambulatory Visit: Payer: Self-pay

## 2020-09-01 ENCOUNTER — Telehealth: Payer: Self-pay | Admitting: Nutrition

## 2020-09-01 NOTE — Telephone Encounter (Signed)
I contacted patient by telephone in response to her missed appointment.  79 year old female diagnosed with Sigmoid Colon cancer on Xeloda. She is followed by Dr. Benay Spice.  PMH includes Depression, Hypothyroidism, Osteoporosis.  Medications include Wellbutrin, Synthroid, MVI, Zofran, Miralax, Compazine.  Labs include K 3.1,Glucose 129, Creatinine 1.02 and albumin 3.4.  Height: 5\' 3" . Weight: 133.1 pounds UBW: 138 pounds BMI: 23.56 (normal)  Patient is very anxious and talkative about many struggles she is experiencing from her new diagnosis. She shared many frustrating situations not related to nutrition. S/P Robotic assisted sigmoid colectomy. She was told to follow a low fiber diet after surgery. Reports MD has told her she can now have a regular diet. Reports she has a lot of nutrition information from the chemo education nurse. Her main complaint is taste alterations. She reports food tastes metallic and soapy. Patient reports she lives in a senior living facility and she does not cook any of her food. She reports she has had a zinc deficiency in the past. She currently denies other nutrition impact symptoms and reports she has been eating normally.  She is trying to add more vegetables back into her diet.  Nutrition diagnosis: Food and nutrition related knowledge deficit related to colon cancer and associated treatments as evidenced by no prior need for nutrition related information.  Intervention: Educated patient on importance of small frequent meals and snacks with adequate calories and protein for weight maintenance. Educated patient on slowly advancing diet from low fiber back to regular foods. Encouraged oral nutrition supplements as needed for weight maintenance.  She was given samples of Ensure and boost and has Premier protein at home which she likes.  I also gave her an option of trying Carnation breakfast essentials. Recommended patient try baking soda and salt water  rinses for taste alterations.  I reviewed other strategies for improving taste. Recommended patient try 50 mg of zinc daily for 2 weeks and then decrease zinc to 15 mg daily.  MD was notified and agrees. Patient has been provided with fact sheets and RD contact information.  All questions were answered.  Teach back method was used.  Monitoring, evaluation, goals: Patient will tolerate adequate calories and protein for weight maintenance.  Next visit: Patient to contact RD with questions or concerns.  **Disclaimer: This note was dictated with voice recognition software. Similar sounding words can inadvertently be transcribed and this note may contain transcription errors which may not have been corrected upon publication of note.**

## 2020-09-01 NOTE — Progress Notes (Signed)
Patient did not show up for face to face appointment.

## 2020-09-08 ENCOUNTER — Inpatient Hospital Stay: Payer: Medicare PPO

## 2020-09-08 ENCOUNTER — Other Ambulatory Visit: Payer: Self-pay | Admitting: Nurse Practitioner

## 2020-09-08 ENCOUNTER — Telehealth: Payer: Self-pay

## 2020-09-08 ENCOUNTER — Other Ambulatory Visit: Payer: Self-pay

## 2020-09-08 ENCOUNTER — Telehealth: Payer: Self-pay | Admitting: Nurse Practitioner

## 2020-09-08 DIAGNOSIS — C187 Malignant neoplasm of sigmoid colon: Secondary | ICD-10-CM | POA: Diagnosis not present

## 2020-09-08 DIAGNOSIS — R300 Dysuria: Secondary | ICD-10-CM

## 2020-09-08 LAB — URINALYSIS, COMPLETE (UACMP) WITH MICROSCOPIC
Bilirubin Urine: NEGATIVE
Glucose, UA: NEGATIVE mg/dL
Ketones, ur: NEGATIVE mg/dL
Nitrite: POSITIVE — AB
Protein, ur: 30 mg/dL — AB
RBC / HPF: >50 RBC/hpf (ref 0–5)
Specific Gravity, Urine: 1.02 (ref 1.005–1.030)
pH: 7 (ref 5.0–8.0)

## 2020-09-08 MED ORDER — CIPROFLOXACIN HCL 500 MG PO TABS: 500.0000 mg | ORAL_TABLET | Freq: Two times a day (BID) | ORAL | 0 refills | Status: AC

## 2020-09-08 NOTE — Telephone Encounter (Signed)
Made Ms Plourde aware u/a is consistent with a UTI. Prescription sent to her pharmacy.

## 2020-09-08 NOTE — Telephone Encounter (Signed)
Patient calls with onset of bladder spasms yesterday, this morning has constant urgency with blood in urine.  I consulted with Ned Card NP and I have advised the patient needs to come give urine specimen for testing.  She verbalized an understanding.

## 2020-09-10 LAB — URINE CULTURE: Culture: >=100000 — AB

## 2020-09-12 ENCOUNTER — Telehealth: Payer: Self-pay

## 2020-09-12 NOTE — Telephone Encounter (Signed)
Patient calls wondering if she should submit another urine sample for testing "to be sure infection is gone".  She finished Cipro in the morning, denies any signs and symptoms of urine infection.  I informed her no need to retest as long as she is symptom free.  She verbalized an understanding.

## 2020-09-13 NOTE — Progress Notes (Signed)
Patient calls needing advice on what to look for with hand-foot syndrome.  She states that last night her right heel is bright pink and felt "damaged or different".  She states it is only her right heel.  I have suggested she wear supportive shoes that are well cushioned to take pressure off her heels and soles of her feet.  She states she has just been wearing bedroom slippers.  Also avoid extreme heat to feet, can use ice pack a couple of times a day.  She denies any skin breakdown and no blistered areas at this time.  She will call back if things worsen.

## 2020-09-15 ENCOUNTER — Other Ambulatory Visit: Payer: Self-pay | Admitting: Oncology

## 2020-09-15 DIAGNOSIS — C187 Malignant neoplasm of sigmoid colon: Secondary | ICD-10-CM

## 2020-09-16 ENCOUNTER — Telehealth: Payer: Self-pay | Admitting: *Deleted

## 2020-09-16 NOTE — Telephone Encounter (Signed)
Reports her feet are better using the ice pack at night--still pink, but better. Reports that her fingertips are very pink, swollen and painful.  Currently she is on cycle #2 (day 12) of 1500 mg bid.

## 2020-09-16 NOTE — Telephone Encounter (Signed)
Per Dr. Benay Spice: Stop Xeloda and f/u as scheduled on 12/22. Left this on her voice mail. Suggested she try cool compress to fingers and apply moisturizer often. Avoid extreme heat w/hands. May need to dose reduce on next cycle.

## 2020-09-21 ENCOUNTER — Inpatient Hospital Stay: Payer: Medicare PPO | Admitting: Oncology

## 2020-09-21 ENCOUNTER — Other Ambulatory Visit: Payer: Self-pay | Admitting: *Deleted

## 2020-09-21 ENCOUNTER — Inpatient Hospital Stay: Payer: Medicare PPO

## 2020-09-21 ENCOUNTER — Other Ambulatory Visit: Payer: Self-pay

## 2020-09-21 VITALS — BP 128/75 | HR 83 | Temp 98.5°F | Resp 18 | Ht 63.0 in | Wt 134.0 lb

## 2020-09-21 DIAGNOSIS — C187 Malignant neoplasm of sigmoid colon: Secondary | ICD-10-CM

## 2020-09-21 DIAGNOSIS — R300 Dysuria: Secondary | ICD-10-CM

## 2020-09-21 LAB — CMP (CANCER CENTER ONLY)
ALT: 17 U/L (ref 0–44)
AST: 20 U/L (ref 15–41)
Albumin: 3.3 g/dL — ABNORMAL LOW (ref 3.5–5.0)
Alkaline Phosphatase: 58 U/L (ref 38–126)
Anion gap: 9 (ref 5–15)
BUN: 26 mg/dL — ABNORMAL HIGH (ref 8–23)
CO2: 29 mmol/L (ref 22–32)
Calcium: 8.8 mg/dL — ABNORMAL LOW (ref 8.9–10.3)
Chloride: 103 mmol/L (ref 98–111)
Creatinine: 1.19 mg/dL — ABNORMAL HIGH (ref 0.44–1.00)
GFR, Estimated: 47 mL/min — ABNORMAL LOW (ref >60)
Glucose, Bld: 133 mg/dL — ABNORMAL HIGH (ref 70–99)
Potassium: 3.1 mmol/L — ABNORMAL LOW (ref 3.5–5.1)
Sodium: 141 mmol/L (ref 135–145)
Total Bilirubin: 1.1 mg/dL (ref 0.3–1.2)
Total Protein: 6.2 g/dL — ABNORMAL LOW (ref 6.5–8.1)

## 2020-09-21 LAB — CBC WITH DIFFERENTIAL (CANCER CENTER ONLY)
Abs Immature Granulocytes: 0.05 10*3/uL (ref 0.00–0.07)
Basophils Absolute: 0.1 10*3/uL (ref 0.0–0.1)
Basophils Relative: 1 %
Eosinophils Absolute: 0.2 10*3/uL (ref 0.0–0.5)
Eosinophils Relative: 2 %
HCT: 40.2 % (ref 36.0–46.0)
Hemoglobin: 13.7 g/dL (ref 12.0–15.0)
Immature Granulocytes: 1 %
Lymphocytes Relative: 28 %
Lymphs Abs: 2.8 10*3/uL (ref 0.7–4.0)
MCH: 33.1 pg (ref 26.0–34.0)
MCHC: 34.1 g/dL (ref 30.0–36.0)
MCV: 97.1 fL (ref 80.0–100.0)
Monocytes Absolute: 0.9 10*3/uL (ref 0.1–1.0)
Monocytes Relative: 9 %
Neutro Abs: 6 10*3/uL (ref 1.7–7.7)
Neutrophils Relative %: 59 %
Platelet Count: 272 10*3/uL (ref 150–400)
RBC: 4.14 MIL/uL (ref 3.87–5.11)
RDW: 17.6 % — ABNORMAL HIGH (ref 11.5–15.5)
WBC Count: 10.1 10*3/uL (ref 4.0–10.5)
nRBC: 0.2 % (ref 0.0–0.2)

## 2020-09-21 MED ORDER — POTASSIUM CHLORIDE CRYS ER 20 MEQ PO TBCR: 20.0000 meq | EXTENDED_RELEASE_TABLET | Freq: Two times a day (BID) | ORAL | 1 refills | Status: DC

## 2020-09-21 MED ORDER — MAGIC MOUTHWASH: 5.0000 mL | Freq: Four times a day (QID) | ORAL | 0 refills | Status: DC | PRN

## 2020-09-21 MED FILL — CAPECITABINE 500 MG TABLET: 500 | 21 days supply | Qty: 56 | Fill #0

## 2020-09-21 NOTE — Progress Notes (Signed)
[place] OFFICE PROGRESS NOTE   Diagnosis: Colon cancer  INTERVAL HISTORY:   Crystal Lewis returns as scheduled.  She began another cycle of Xeloda on 09/05/2020.  She developed erythema at the palms and soles during the second week of Xeloda.  She had discomfort at the left foot.  She also reports burning at the inner lips.  No mouth ulcers.  She discontinue Xeloda after the morning dose on 09/15/2020.  Her symptoms have improved.  No diarrhea.  She reports altered taste.  This makes it difficult to eat.  The urinary tract infection symptoms resolved after antibiotics.  Objective:  Vital signs in last 24 hours:  Blood pressure 128/75, pulse 83, temperature 98.5 F (36.9 C), temperature source Tympanic, resp. rate 18, height '5\' 3"'  (1.6 m), weight 134 lb (60.8 kg), SpO2 99 %.    HEENT: No thrush or ulcers Resp: Lungs clear bilaterally Cardio: Regular rate and rhythm GI: No hepatomegaly, nontender Vascular: No leg edema  Skin: Mild erythema and skin thickening at the palms.  Mild erythema skin thickening and callus formation at the soles    Lab Results:  Lab Results  Component Value Date   WBC 10.1 09/21/2020   HGB 13.7 09/21/2020   HCT 40.2 09/21/2020   MCV 97.1 09/21/2020   PLT 272 09/21/2020   NEUTROABS 6.0 09/21/2020    CMP  Lab Results  Component Value Date   NA 141 09/21/2020   K 3.1 (L) 09/21/2020   CL 103 09/21/2020   CO2 29 09/21/2020   GLUCOSE 133 (H) 09/21/2020   BUN 26 (H) 09/21/2020   CREATININE 1.19 (H) 09/21/2020   CALCIUM 8.8 (L) 09/21/2020   PROT 6.2 (L) 09/21/2020   ALBUMIN 3.3 (L) 09/21/2020   AST 20 09/21/2020   ALT 17 09/21/2020   ALKPHOS 58 09/21/2020   BILITOT 1.1 09/21/2020   GFRNONAA 47 (L) 09/21/2020    Lab Results  Component Value Date   CEA1 1.6 07/07/2020     Medications: I have reviewed the patient's current medications.   Assessment/Plan: 1. Colon cancer, sigmoid, stage IIIb (T3N1), status post a robotic  sigmoid colectomy 07/12/2020          Colonoscopy 06/10/2020-sigmoid colon mass-invasive well differentiated adenocarcinoma, mismatch repair protein expression intact, incomplete colonoscopy?  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 1/12 lymph nodes, lymphovascular invasion present ? Intact mismatch repair protein expression ? Tumor at the distal sigmoid colon proximal to the peritoneal reflection ? Cycle 1 adjuvant Xeloda 08/15/2020 ? Cycle 2 adjuvant Xeloda 09/05/2020, discontinued after the morning dose on 09/15/2020 secondary to mouth soreness and hand/foot syndrome ? Cycle 3 adjuvant Xeloda 09/26/2020, Xeloda dose reduced to 1000 mg twice daily 2. Hypertension 3. Family history of breast and colon cancer 4. Depression 5. Hypothyroidism 6. Oral candidiasis following surgery October 2021 7. Urinary tract infection 09/08/2020 8. Hand/foot syndrome secondary Xeloda-Xeloda dose reduced beginning with cycle 3 9. Hypokalemia secondary to diuretic therapy    Disposition: Crystal Lewis has completed 2 cycles of Xeloda.  Cycle 2 was complicated by hand/foot syndrome and mild mouth soreness.  Her symptoms  have improved.  Xeloda will be on dose reduced beginning with cycle 3 on 09/26/2020.  Crystal Lewis will return for an office and lab visit on 10/13/2019.  She will increase the potassium supplement to twice daily.  Betsy Coder, MD  09/21/2020  10:40 AM

## 2020-09-21 NOTE — Progress Notes (Signed)
Called in [facility] and refilled K+ with new directions per Dr. Benay Spice order.

## 2020-09-22 ENCOUNTER — Other Ambulatory Visit: Payer: Self-pay | Admitting: *Deleted

## 2020-09-22 ENCOUNTER — Other Ambulatory Visit: Payer: Self-pay

## 2020-09-22 DIAGNOSIS — C187 Malignant neoplasm of sigmoid colon: Secondary | ICD-10-CM | POA: Diagnosis not present

## 2020-09-22 LAB — URINALYSIS, COMPLETE (UACMP) WITH MICROSCOPIC
Bilirubin Urine: NEGATIVE
Glucose, UA: NEGATIVE mg/dL
Hgb urine dipstick: NEGATIVE
Ketones, ur: NEGATIVE mg/dL
Leukocytes,Ua: NEGATIVE
Nitrite: NEGATIVE
Protein, ur: NEGATIVE mg/dL
Specific Gravity, Urine: 1.011 (ref 1.005–1.030)
pH: 5 (ref 5.0–8.0)

## 2020-09-22 NOTE — Progress Notes (Signed)
Patient brought urine sample into office and requesting UA/ OK per Dr. Benay Spice.

## 2020-09-26 ENCOUNTER — Telehealth: Payer: Self-pay | Admitting: *Deleted

## 2020-09-26 NOTE — Telephone Encounter (Signed)
Reports has had diarrhea (3/day) all day since 12/23 to 12/25. Also her heels are pink and it was painful to walk on left heel over weekend. Everything has resolved today. Asking if she should start her Xeloda today or delay a week?  Per Dr. Truett Perna: resume today at 1000 mg bid as ordered.

## 2020-10-03 ENCOUNTER — Other Ambulatory Visit: Payer: Self-pay | Admitting: Oncology

## 2020-10-03 DIAGNOSIS — C187 Malignant neoplasm of sigmoid colon: Secondary | ICD-10-CM

## 2020-10-11 ENCOUNTER — Telehealth: Payer: Self-pay

## 2020-10-11 NOTE — Telephone Encounter (Signed)
Confirmed appointment for 10/12/20 at 11:00 am. Patient verbalized understanding.

## 2020-10-12 ENCOUNTER — Inpatient Hospital Stay: Payer: Medicare PPO | Admitting: Nurse Practitioner

## 2020-10-12 ENCOUNTER — Telehealth: Payer: Self-pay | Admitting: Nurse Practitioner

## 2020-10-12 ENCOUNTER — Other Ambulatory Visit: Payer: Self-pay

## 2020-10-12 ENCOUNTER — Encounter: Payer: Self-pay | Admitting: Nurse Practitioner

## 2020-10-12 ENCOUNTER — Inpatient Hospital Stay: Payer: Medicare PPO | Attending: Nurse Practitioner

## 2020-10-12 VITALS — BP 143/75 | HR 81 | Temp 97.3°F | Resp 17 | Wt 137.4 lb

## 2020-10-12 DIAGNOSIS — C187 Malignant neoplasm of sigmoid colon: Secondary | ICD-10-CM

## 2020-10-12 DIAGNOSIS — I10 Essential (primary) hypertension: Secondary | ICD-10-CM | POA: Diagnosis not present

## 2020-10-12 DIAGNOSIS — F32A Depression, unspecified: Secondary | ICD-10-CM | POA: Insufficient documentation

## 2020-10-12 DIAGNOSIS — E039 Hypothyroidism, unspecified: Secondary | ICD-10-CM | POA: Insufficient documentation

## 2020-10-12 DIAGNOSIS — E876 Hypokalemia: Secondary | ICD-10-CM | POA: Insufficient documentation

## 2020-10-12 LAB — CMP (CANCER CENTER ONLY)
ALT: 14 U/L (ref 0–44)
AST: 16 U/L (ref 15–41)
Albumin: 3.3 g/dL — ABNORMAL LOW (ref 3.5–5.0)
Alkaline Phosphatase: 73 U/L (ref 38–126)
Anion gap: 5 (ref 5–15)
BUN: 18 mg/dL (ref 8–23)
CO2: 32 mmol/L (ref 22–32)
Calcium: 9 mg/dL (ref 8.9–10.3)
Chloride: 104 mmol/L (ref 98–111)
Creatinine: 1.06 mg/dL — ABNORMAL HIGH (ref 0.44–1.00)
GFR, Estimated: 54 mL/min — ABNORMAL LOW (ref >60)
Glucose, Bld: 95 mg/dL (ref 70–99)
Potassium: 3.5 mmol/L (ref 3.5–5.1)
Sodium: 141 mmol/L (ref 135–145)
Total Bilirubin: 1.2 mg/dL (ref 0.3–1.2)
Total Protein: 6.4 g/dL — ABNORMAL LOW (ref 6.5–8.1)

## 2020-10-12 LAB — CBC WITH DIFFERENTIAL (CANCER CENTER ONLY)
Abs Immature Granulocytes: 0.03 10*3/uL (ref 0.00–0.07)
Basophils Absolute: 0.1 10*3/uL (ref 0.0–0.1)
Basophils Relative: 1 %
Eosinophils Absolute: 0.2 10*3/uL (ref 0.0–0.5)
Eosinophils Relative: 2 %
HCT: 37.5 % (ref 36.0–46.0)
Hemoglobin: 12.9 g/dL (ref 12.0–15.0)
Immature Granulocytes: 0 %
Lymphocytes Relative: 29 %
Lymphs Abs: 2.5 10*3/uL (ref 0.7–4.0)
MCH: 34.2 pg — ABNORMAL HIGH (ref 26.0–34.0)
MCHC: 34.4 g/dL (ref 30.0–36.0)
MCV: 99.5 fL (ref 80.0–100.0)
Monocytes Absolute: 1 10*3/uL (ref 0.1–1.0)
Monocytes Relative: 12 %
Neutro Abs: 4.9 10*3/uL (ref 1.7–7.7)
Neutrophils Relative %: 56 %
Platelet Count: 231 10*3/uL (ref 150–400)
RBC: 3.77 MIL/uL — ABNORMAL LOW (ref 3.87–5.11)
RDW: 19.6 % — ABNORMAL HIGH (ref 11.5–15.5)
WBC Count: 8.6 10*3/uL (ref 4.0–10.5)
nRBC: 0 % (ref 0.0–0.2)

## 2020-10-12 LAB — MAGNESIUM: Magnesium: 1.8 mg/dL (ref 1.7–2.4)

## 2020-10-12 NOTE — Telephone Encounter (Signed)
Scheduled appointments per 1/12 los. Spoke to patient who is aware of appointments dates and times.  

## 2020-10-12 NOTE — Progress Notes (Signed)
  [facility] OFFICE PROGRESS NOTE   Diagnosis: Colon cancer  INTERVAL HISTORY:   Crystal Lewis returns as scheduled.  She completed cycle 3 Xeloda beginning 09/26/2020.  Xeloda was dose reduced due to hand-foot syndrome and mild mouth soreness.  She did not experience mouth sores with the most recent cycle of Xeloda.  She denies hand or foot pain.  She notes some redness intermittently on her soles.  No nausea or vomiting.  No diarrhea.  Objective:  Vital signs in last 24 hours:  Blood pressure (!) 143/75, pulse 81, temperature (!) 97.3 F (36.3 C), temperature source Oral, resp. rate 17, weight 137 lb 6.4 oz (62.3 kg), SpO2 100 %.    HEENT: No thrush or ulcers. Resp: Lungs clear bilaterally. Cardio: Regular rate and rhythm. GI: Abdomen soft and nontender.  No hepatomegaly. Vascular: No leg edema. Skin: Palms without erythema.  Soles with mild erythema.  No skin breakdown.  Nontender. Port-A-Cath without erythema.   Lab Results:  Lab Results  Component Value Date   WBC 8.6 10/12/2020   HGB 12.9 10/12/2020   HCT 37.5 10/12/2020   MCV 99.5 10/12/2020   PLT 231 10/12/2020   NEUTROABS 4.9 10/12/2020    Imaging:  No results found.  Medications: I have reviewed the patient's current medications.  Assessment/Plan: 1. Colon cancer, sigmoid, stage IIIb (T3N1), status post a robotic sigmoid colectomy 07/12/2020  Colonoscopy 06/10/2020-sigmoid colon mass-invasive well differentiated adenocarcinoma, mismatch repair protein expression intact, incomplete colonoscopy?  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 1/12 lymph nodes, lymphovascular invasion present ? Intact mismatch repair protein expression ? Tumor at the distal sigmoid colon proximal to the peritoneal reflection ? Cycle 1 adjuvant Xeloda 08/15/2020 ? Cycle 2 adjuvant Xeloda 09/05/2020, discontinued after the morning dose on 09/15/2020 secondary to mouth soreness and hand/foot syndrome ? Cycle 3 adjuvant Xeloda 09/26/2020, Xeloda dose reduced to 1000 mg twice daily ? Cycle 4 adjuvant Xeloda 10/17/2020 (Xeloda 1000 mg twice daily) 2. Hypertension 3. Family history of breast and colon cancer 4. Depression 5. Hypothyroidism 6. Oral candidiasis following surgery October 2021 7. Urinary tract infection 09/08/2020 8. Hand/foot syndrome secondary Xeloda-Xeloda dose reduced beginning with cycle 3 9. Hypokalemia secondary to diuretic therapy   Disposition: [name] appears stable.  She has completed 3 cycles of adjuvant Xeloda.  Xeloda was dose reduced with cycle 3 due to mouth sores and hand-foot syndrome.  She tolerated well.  Plan to proceed with cycle 4 as scheduled beginning 10/17/2020, same dose as cycle 3.   We reviewed the CBC from today.  Counts adequate to proceed as above.  She will return for lab and follow-up in 3 weeks.  She will contact the office in the interim with any problems.    Ned Card ANP/GNP-BC   10/12/2020  12:04 PM

## 2020-10-14 ENCOUNTER — Other Ambulatory Visit: Payer: Self-pay | Admitting: Oncology

## 2020-10-14 DIAGNOSIS — C187 Malignant neoplasm of sigmoid colon: Secondary | ICD-10-CM

## 2020-10-14 MED FILL — CAPECITABINE 500 MG TABLET: 500 | 21 days supply | Qty: 56 | Fill #0

## 2020-10-24 ENCOUNTER — Telehealth: Payer: Self-pay

## 2020-10-24 NOTE — Telephone Encounter (Signed)
Patient calls stating the thrush has flared back up again.  She says she has tried  Nystatin and some sort of pills that didn't work.  She is wondering is there anything else.   Her [phone]

## 2020-10-25 ENCOUNTER — Other Ambulatory Visit: Payer: Self-pay | Admitting: Nurse Practitioner

## 2020-10-25 ENCOUNTER — Telehealth: Payer: Self-pay

## 2020-10-25 DIAGNOSIS — C187 Malignant neoplasm of sigmoid colon: Secondary | ICD-10-CM

## 2020-10-25 MED ORDER — CLOTRIMAZOLE 10 MG MT TROC
10.0000 mg | Freq: Four times a day (QID) | OROMUCOSAL | 0 refills | Status: AC
Start: 2020-10-25 — End: 2020-10-30

## 2020-10-25 NOTE — Telephone Encounter (Signed)
Informed patient that Ned Card NP has sent in Mycelex troche 10 mg tablets take 4 times a day for 5 days into [place] [facility] on file. She verbalized an understanding.

## 2020-11-01 ENCOUNTER — Inpatient Hospital Stay: Payer: Medicare PPO | Attending: Nurse Practitioner

## 2020-11-01 ENCOUNTER — Other Ambulatory Visit: Payer: Medicare PPO

## 2020-11-01 ENCOUNTER — Other Ambulatory Visit: Payer: Self-pay

## 2020-11-01 ENCOUNTER — Ambulatory Visit: Payer: Medicare PPO | Admitting: Oncology

## 2020-11-01 ENCOUNTER — Inpatient Hospital Stay (HOSPITAL_BASED_OUTPATIENT_CLINIC_OR_DEPARTMENT_OTHER): Payer: Medicare PPO | Admitting: Oncology

## 2020-11-01 VITALS — BP 127/74 | HR 80 | Temp 97.8°F | Resp 15 | Ht 63.0 in | Wt 140.6 lb

## 2020-11-01 DIAGNOSIS — C187 Malignant neoplasm of sigmoid colon: Secondary | ICD-10-CM

## 2020-11-01 DIAGNOSIS — E876 Hypokalemia: Secondary | ICD-10-CM | POA: Diagnosis not present

## 2020-11-01 DIAGNOSIS — E039 Hypothyroidism, unspecified: Secondary | ICD-10-CM | POA: Insufficient documentation

## 2020-11-01 DIAGNOSIS — F32A Depression, unspecified: Secondary | ICD-10-CM | POA: Insufficient documentation

## 2020-11-01 DIAGNOSIS — I10 Essential (primary) hypertension: Secondary | ICD-10-CM | POA: Insufficient documentation

## 2020-11-01 LAB — CBC WITH DIFFERENTIAL (CANCER CENTER ONLY)
Abs Immature Granulocytes: 0.03 10*3/uL (ref 0.00–0.07)
Basophils Absolute: 0.1 10*3/uL (ref 0.0–0.1)
Basophils Relative: 1 %
Eosinophils Absolute: 0.1 10*3/uL (ref 0.0–0.5)
Eosinophils Relative: 1 %
HCT: 37 % (ref 36.0–46.0)
Hemoglobin: 12.8 g/dL (ref 12.0–15.0)
Immature Granulocytes: 0 %
Lymphocytes Relative: 27 %
Lymphs Abs: 2.1 10*3/uL (ref 0.7–4.0)
MCH: 35.7 pg — ABNORMAL HIGH (ref 26.0–34.0)
MCHC: 34.6 g/dL (ref 30.0–36.0)
MCV: 103.1 fL — ABNORMAL HIGH (ref 80.0–100.0)
Monocytes Absolute: 0.9 10*3/uL (ref 0.1–1.0)
Monocytes Relative: 11 %
Neutro Abs: 4.8 10*3/uL (ref 1.7–7.7)
Neutrophils Relative %: 60 %
Platelet Count: 225 10*3/uL (ref 150–400)
RBC: 3.59 MIL/uL — ABNORMAL LOW (ref 3.87–5.11)
RDW: 18.9 % — ABNORMAL HIGH (ref 11.5–15.5)
WBC Count: 8 10*3/uL (ref 4.0–10.5)
nRBC: 0 % (ref 0.0–0.2)

## 2020-11-01 LAB — CMP (CANCER CENTER ONLY)
ALT: 14 U/L (ref 0–44)
AST: 20 U/L (ref 15–41)
Albumin: 3.5 g/dL (ref 3.5–5.0)
Alkaline Phosphatase: 74 U/L (ref 38–126)
Anion gap: 8 (ref 5–15)
BUN: 19 mg/dL (ref 8–23)
CO2: 33 mmol/L — ABNORMAL HIGH (ref 22–32)
Calcium: 9.4 mg/dL (ref 8.9–10.3)
Chloride: 102 mmol/L (ref 98–111)
Creatinine: 1.18 mg/dL — ABNORMAL HIGH (ref 0.44–1.00)
GFR, Estimated: 47 mL/min — ABNORMAL LOW (ref >60)
Glucose, Bld: 104 mg/dL — ABNORMAL HIGH (ref 70–99)
Potassium: 3.2 mmol/L — ABNORMAL LOW (ref 3.5–5.1)
Sodium: 143 mmol/L (ref 135–145)
Total Bilirubin: 1.6 mg/dL — ABNORMAL HIGH (ref 0.3–1.2)
Total Protein: 6.5 g/dL (ref 6.5–8.1)

## 2020-11-01 NOTE — Progress Notes (Signed)
[place] OFFICE PROGRESS NOTE   Diagnosis: Colon cancer  INTERVAL HISTORY:   Crystal Lewis returns for a scheduled visit.  She began another cycle of Xeloda on 10/17/2020.  She reports soreness at the lower inner lip without discrete ulcers.  She continues to have a white coat over the tongue.  No hand or foot pain.  No nausea or diarrhea.  Objective:  Vital signs in last 24 hours:  Blood pressure 127/74, pulse 80, temperature 97.8 F (36.6 C), temperature source Tympanic, resp. rate 15, height '5\' 3"'  (1.6 m), weight 140 lb 9.6 oz (63.8 kg), SpO2 100 %.    HEENT: Mild white coat over the tongue, no buccal thrush, no ulcers, mild angular cheilitis Resp: Lungs clear bilaterally Cardio: Regular rate and rhythm GI: No hepatomegaly Vascular: No leg edema  Skin: Palms without erythema,, skin thickening and mild erythema at the soles, callus formation at the heels   Lab Results:  Lab Results  Component Value Date   WBC 8.0 11/01/2020   HGB 12.8 11/01/2020   HCT 37.0 11/01/2020   MCV 103.1 (H) 11/01/2020   PLT 225 11/01/2020   NEUTROABS 4.8 11/01/2020    CMP  Lab Results  Component Value Date   NA 143 11/01/2020   K 3.2 (L) 11/01/2020   CL 102 11/01/2020   CO2 33 (H) 11/01/2020   GLUCOSE 104 (H) 11/01/2020   BUN 19 11/01/2020   CREATININE 1.18 (H) 11/01/2020   CALCIUM 9.4 11/01/2020   PROT 6.5 11/01/2020   ALBUMIN 3.5 11/01/2020   AST 20 11/01/2020   ALT 14 11/01/2020   ALKPHOS 74 11/01/2020   BILITOT 1.6 (H) 11/01/2020   GFRNONAA 47 (L) 11/01/2020    Lab Results  Component Value Date   CEA1 1.6 07/07/2020     Medications: I have reviewed the patient's current medications.   Assessment/Plan: 1. Colon cancer, sigmoid, stage IIIb (T3N1), status post a robotic sigmoid colectomy 07/12/2020  Colonoscopy 06/10/2020-sigmoid colon mass-invasive well differentiated adenocarcinoma, mismatch repair protein expression intact, incomplete  colonoscopy?  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 1/12 lymph nodes, lymphovascular invasion present ? Intact mismatch repair protein expression ? Tumor at the distal sigmoid colon proximal to the peritoneal reflection ? Cycle 1 adjuvant Xeloda 08/15/2020 ? Cycle 2 adjuvant Xeloda 09/05/2020, discontinued after the morning dose on 09/15/2020 secondary to mouth soreness and hand/foot syndrome ? Cycle 3 adjuvant Xeloda 09/26/2020, Xeloda dose reduced to 1000 mg twice daily ? Cycle 4 adjuvant Xeloda 10/17/2020 (Xeloda 1000 mg twice daily) ? Cycle 5 adjuvant Xeloda 11/07/2020 2. Hypertension 3. Family history of breast and colon cancer 4. Depression 5. Hypothyroidism 6. Oral candidiasis following surgery October 2021 7. Urinary tract infection 09/08/2020 8. Hand/foot syndrome secondary Xeloda-Xeloda dose reduced beginning with cycle 3 9. Hypokalemia secondary to diuretic therapy     Disposition: [name] is in remission from colon cancer.  She is tolerating Xeloda well.  She will complete another cycle beginning 11/07/2020.  The Xeloda will be dose escalated to 1500 mg in the a.m. and 1000 g in the p.m.  She will call for  hand/foot symptoms with this cycle of chemotherapy.  [name] will return for an office and lab visit on 11/22/2020  Betsy Coder, MD  11/01/2020  3:42 PM

## 2020-11-02 ENCOUNTER — Telehealth: Payer: Self-pay | Admitting: Oncology

## 2020-11-02 ENCOUNTER — Other Ambulatory Visit: Payer: Self-pay

## 2020-11-02 ENCOUNTER — Other Ambulatory Visit: Payer: Self-pay | Admitting: Oncology

## 2020-11-02 DIAGNOSIS — C187 Malignant neoplasm of sigmoid colon: Secondary | ICD-10-CM

## 2020-11-02 MED ORDER — POTASSIUM CHLORIDE ER 10 MEQ PO CPCR: 20.0000 meq | ORAL_CAPSULE | Freq: Two times a day (BID) | ORAL | 1 refills | Status: DC

## 2020-11-02 MED ORDER — CAPECITABINE 500 MG PO TABS: ORAL_TABLET | ORAL | 0 refills | Status: DC

## 2020-11-02 MED FILL — CAPECITABINE 500 MG TABLET: 500 | 21 days supply | Qty: 70 | Fill #0

## 2020-11-02 NOTE — Telephone Encounter (Signed)
Pt contacted regarding medication refill and order. Pt verbalized understanding.

## 2020-11-02 NOTE — Telephone Encounter (Signed)
Scheduled appointments per 2/1 los. Spoke to patient who is aware of appointments date and times.  

## 2020-11-04 ENCOUNTER — Telehealth: Payer: Self-pay | Admitting: Nurse Practitioner

## 2020-11-04 NOTE — Telephone Encounter (Signed)
Patient called to reschedule appointments to 2/23. Moved appointments per patient's request.

## 2020-11-21 ENCOUNTER — Ambulatory Visit: Payer: Medicare PPO | Admitting: Nurse Practitioner

## 2020-11-21 ENCOUNTER — Other Ambulatory Visit: Payer: Medicare PPO

## 2020-11-23 ENCOUNTER — Inpatient Hospital Stay: Payer: Medicare PPO

## 2020-11-23 ENCOUNTER — Other Ambulatory Visit: Payer: Self-pay

## 2020-11-23 ENCOUNTER — Encounter: Payer: Self-pay | Admitting: Nurse Practitioner

## 2020-11-23 ENCOUNTER — Inpatient Hospital Stay: Payer: Medicare PPO | Admitting: Nurse Practitioner

## 2020-11-23 VITALS — BP 126/64 | HR 77 | Temp 98.1°F | Resp 18 | Ht 63.0 in | Wt 141.2 lb

## 2020-11-23 DIAGNOSIS — C187 Malignant neoplasm of sigmoid colon: Secondary | ICD-10-CM

## 2020-11-23 LAB — CBC WITH DIFFERENTIAL (CANCER CENTER ONLY)
Abs Immature Granulocytes: 0.01 10*3/uL (ref 0.00–0.07)
Basophils Absolute: 0.1 10*3/uL (ref 0.0–0.1)
Basophils Relative: 1 %
Eosinophils Absolute: 0.1 10*3/uL (ref 0.0–0.5)
Eosinophils Relative: 2 %
HCT: 34.3 % — ABNORMAL LOW (ref 36.0–46.0)
Hemoglobin: 11.6 g/dL — ABNORMAL LOW (ref 12.0–15.0)
Immature Granulocytes: 0 %
Lymphocytes Relative: 28 %
Lymphs Abs: 1.6 10*3/uL (ref 0.7–4.0)
MCH: 36.5 pg — ABNORMAL HIGH (ref 26.0–34.0)
MCHC: 33.8 g/dL (ref 30.0–36.0)
MCV: 107.9 fL — ABNORMAL HIGH (ref 80.0–100.0)
Monocytes Absolute: 0.8 10*3/uL (ref 0.1–1.0)
Monocytes Relative: 13 %
Neutro Abs: 3.3 10*3/uL (ref 1.7–7.7)
Neutrophils Relative %: 56 %
Platelet Count: 224 10*3/uL (ref 150–400)
RBC: 3.18 MIL/uL — ABNORMAL LOW (ref 3.87–5.11)
RDW: 17.5 % — ABNORMAL HIGH (ref 11.5–15.5)
WBC Count: 5.8 10*3/uL (ref 4.0–10.5)
nRBC: 0 % (ref 0.0–0.2)

## 2020-11-23 LAB — CMP (CANCER CENTER ONLY)
ALT: 9 U/L (ref 0–44)
AST: 17 U/L (ref 15–41)
Albumin: 3.4 g/dL — ABNORMAL LOW (ref 3.5–5.0)
Alkaline Phosphatase: 68 U/L (ref 38–126)
Anion gap: 7 (ref 5–15)
BUN: 16 mg/dL (ref 8–23)
CO2: 29 mmol/L (ref 22–32)
Calcium: 8.7 mg/dL — ABNORMAL LOW (ref 8.9–10.3)
Chloride: 104 mmol/L (ref 98–111)
Creatinine: 1.07 mg/dL — ABNORMAL HIGH (ref 0.44–1.00)
GFR, Estimated: 53 mL/min — ABNORMAL LOW (ref >60)
Glucose, Bld: 103 mg/dL — ABNORMAL HIGH (ref 70–99)
Potassium: 3.5 mmol/L (ref 3.5–5.1)
Sodium: 140 mmol/L (ref 135–145)
Total Bilirubin: 1.1 mg/dL (ref 0.3–1.2)
Total Protein: 6.1 g/dL — ABNORMAL LOW (ref 6.5–8.1)

## 2020-11-23 NOTE — Progress Notes (Signed)
  [facility] OFFICE PROGRESS NOTE   Diagnosis: Colon cancer  INTERVAL HISTORY:   Crystal Lewis returns as scheduled.  She completed cycle 5 adjuvant Xeloda beginning 11/07/2020.  She denies nausea/vomiting.  No mouth sores though she did note a stinging sensation on her lips for a few days.  No diarrhea.  No hand or foot pain.  She noted redness over the soles of both feet.  Objective:  Vital signs in last 24 hours:  Blood pressure 126/64, pulse 77, temperature 98.1 F (36.7 C), temperature source Tympanic, resp. rate 18, height $RemoveBe'5\' 3"'AYSShWGlb$  (1.6 m), weight 141 lb 3.2 oz (64 kg), SpO2 100 %.    HEENT: No thrush or ulcers. Resp: Lungs clear bilaterally. Cardio: Regular rate and rhythm. GI: Abdomen soft and nontender.  No hepatomegaly. Vascular: No leg edema.  Skin: Palms without erythema.  Soles with mild erythema.  No skin breakdown.   Lab Results:  Lab Results  Component Value Date   WBC 5.8 11/23/2020   HGB 11.6 (L) 11/23/2020   HCT 34.3 (L) 11/23/2020   MCV 107.9 (H) 11/23/2020   PLT 224 11/23/2020   NEUTROABS 3.3 11/23/2020    Imaging:  No results found.  Medications: I have reviewed the patient's current medications.  Assessment/Plan: 1. Colon cancer, sigmoid, stage IIIb (T3N1), status post a robotic sigmoid colectomy 07/12/2020  Colonoscopy 06/10/2020-sigmoid colon mass-invasive well differentiated adenocarcinoma, mismatch repair protein expression intact, incomplete colonoscopy?  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 1/12 lymph nodes,  lymphovascular invasion present ? Intact mismatch repair protein expression ? Tumor at the distal sigmoid colon proximal to the peritoneal reflection ? Cycle 1 adjuvant Xeloda 08/15/2020 ? Cycle 2 adjuvant Xeloda 09/05/2020, discontinued after the morning dose on 09/15/2020 secondary to mouth soreness and hand/foot syndrome ? Cycle 3 adjuvant Xeloda 09/26/2020, Xeloda dose reduced to 1000 mg twice daily ? Cycle 4 adjuvant Xeloda 10/17/2020 (Xeloda 1000 mg twice daily) ? Cycle 5 adjuvant Xeloda 11/07/2020 (dose escalated to 1500 mg every morning and 1000 mg every afternoon) ? Cycle 6 adjuvant Xeloda 11/28/2020 (1500 mg every morning and 1000 mg every afternoon) 2. Hypertension 3. Family history of breast and colon cancer 4. Depression 5. Hypothyroidism 6. Oral candidiasis following surgery October 2021 7. Urinary tract infection 09/08/2020 8. Hand/foot syndrome secondary Xeloda-Xeloda dose reduced beginning with cycle 3 9. Hypokalemia secondary to diuretic therapy   Disposition: Crystal Lewis appears stable.  She has completed 5 cycles of adjuvant Xeloda.  She tolerated the escalated dose without significant toxicity.  Plan to proceed with cycle 6 as scheduled beginning 11/28/2020, same dose as cycle 5.  She understands to contact the office with mouth sores, diarrhea, progressive hand-foot symptoms.  We reviewed the CBC and chemistry panel from today.  Labs adequate to continue with Xeloda as above.  She will return for lab and follow-up in 3 weeks.    Ned Card ANP/GNP-BC   11/23/2020  2:46 PM

## 2020-11-24 ENCOUNTER — Other Ambulatory Visit: Payer: Self-pay | Admitting: Oncology

## 2020-11-24 DIAGNOSIS — C187 Malignant neoplasm of sigmoid colon: Secondary | ICD-10-CM

## 2020-11-25 MED FILL — CAPECITABINE 500 MG TABLET: 500 | 21 days supply | Qty: 70 | Fill #0

## 2020-12-01 ENCOUNTER — Telehealth: Payer: Self-pay | Admitting: *Deleted

## 2020-12-01 NOTE — Telephone Encounter (Signed)
Reports before starting this cycle of Xeloda on 2/28, her hands and soles of feet have been bright red. She decided on her own to decrease the Xeloda to 1000 mg bid. Cool compresses have helped and they are looking and feeling better. MD notified: "OK".

## 2020-12-13 ENCOUNTER — Other Ambulatory Visit: Payer: Self-pay

## 2020-12-13 ENCOUNTER — Other Ambulatory Visit: Payer: Self-pay | Admitting: Oncology

## 2020-12-13 ENCOUNTER — Inpatient Hospital Stay: Payer: Medicare PPO | Attending: Nurse Practitioner

## 2020-12-13 ENCOUNTER — Telehealth: Payer: Self-pay | Admitting: Oncology

## 2020-12-13 ENCOUNTER — Inpatient Hospital Stay: Payer: Medicare PPO | Admitting: Oncology

## 2020-12-13 VITALS — BP 142/66 | HR 75 | Temp 97.7°F | Resp 18 | Ht 63.0 in | Wt 144.0 lb

## 2020-12-13 DIAGNOSIS — E876 Hypokalemia: Secondary | ICD-10-CM | POA: Diagnosis not present

## 2020-12-13 DIAGNOSIS — T451X5A Adverse effect of antineoplastic and immunosuppressive drugs, initial encounter: Secondary | ICD-10-CM | POA: Diagnosis not present

## 2020-12-13 DIAGNOSIS — L271 Localized skin eruption due to drugs and medicaments taken internally: Secondary | ICD-10-CM | POA: Diagnosis not present

## 2020-12-13 DIAGNOSIS — C187 Malignant neoplasm of sigmoid colon: Secondary | ICD-10-CM

## 2020-12-13 DIAGNOSIS — E039 Hypothyroidism, unspecified: Secondary | ICD-10-CM | POA: Diagnosis not present

## 2020-12-13 DIAGNOSIS — C779 Secondary and unspecified malignant neoplasm of lymph node, unspecified: Secondary | ICD-10-CM | POA: Diagnosis not present

## 2020-12-13 LAB — CMP (CANCER CENTER ONLY)
ALT: 14 U/L (ref 0–44)
AST: 17 U/L (ref 15–41)
Albumin: 3.4 g/dL — ABNORMAL LOW (ref 3.5–5.0)
Alkaline Phosphatase: 80 U/L (ref 38–126)
Anion gap: 6 (ref 5–15)
BUN: 20 mg/dL (ref 8–23)
CO2: 31 mmol/L (ref 22–32)
Calcium: 8.7 mg/dL — ABNORMAL LOW (ref 8.9–10.3)
Chloride: 103 mmol/L (ref 98–111)
Creatinine: 1.11 mg/dL — ABNORMAL HIGH (ref 0.44–1.00)
GFR, Estimated: 51 mL/min — ABNORMAL LOW (ref >60)
Glucose, Bld: 111 mg/dL — ABNORMAL HIGH (ref 70–99)
Potassium: 3.3 mmol/L — ABNORMAL LOW (ref 3.5–5.1)
Sodium: 140 mmol/L (ref 135–145)
Total Bilirubin: 1.4 mg/dL — ABNORMAL HIGH (ref 0.3–1.2)
Total Protein: 6.3 g/dL — ABNORMAL LOW (ref 6.5–8.1)

## 2020-12-13 LAB — CBC WITH DIFFERENTIAL (CANCER CENTER ONLY)
Abs Immature Granulocytes: 0.01 10*3/uL (ref 0.00–0.07)
Basophils Absolute: 0.1 10*3/uL (ref 0.0–0.1)
Basophils Relative: 1 %
Eosinophils Absolute: 0.1 10*3/uL (ref 0.0–0.5)
Eosinophils Relative: 2 %
HCT: 37.4 % (ref 36.0–46.0)
Hemoglobin: 12.6 g/dL (ref 12.0–15.0)
Immature Granulocytes: 0 %
Lymphocytes Relative: 25 %
Lymphs Abs: 1.6 10*3/uL (ref 0.7–4.0)
MCH: 36.8 pg — ABNORMAL HIGH (ref 26.0–34.0)
MCHC: 33.7 g/dL (ref 30.0–36.0)
MCV: 109.4 fL — ABNORMAL HIGH (ref 80.0–100.0)
Monocytes Absolute: 0.7 10*3/uL (ref 0.1–1.0)
Monocytes Relative: 11 %
Neutro Abs: 3.9 10*3/uL (ref 1.7–7.7)
Neutrophils Relative %: 61 %
Platelet Count: 220 10*3/uL (ref 150–400)
RBC: 3.42 MIL/uL — ABNORMAL LOW (ref 3.87–5.11)
RDW: 15.9 % — ABNORMAL HIGH (ref 11.5–15.5)
WBC Count: 6.4 10*3/uL (ref 4.0–10.5)
nRBC: 0 % (ref 0.0–0.2)

## 2020-12-13 LAB — MAGNESIUM: Magnesium: 1.9 mg/dL (ref 1.7–2.4)

## 2020-12-13 MED ORDER — POTASSIUM CHLORIDE ER 10 MEQ PO CPCR: 20.0000 meq | ORAL_CAPSULE | Freq: Two times a day (BID) | ORAL | 1 refills | Status: DC

## 2020-12-13 MED ORDER — CAPECITABINE 500 MG PO TABS: ORAL_TABLET | ORAL | 0 refills | Status: DC

## 2020-12-13 NOTE — Progress Notes (Signed)
[facility] OFFICE PROGRESS NOTE   Diagnosis: Colon cancer  INTERVAL HISTORY:   Crystal Lewis returns as scheduled.  She completed another cycle of Xeloda beginning 11/28/2020.  She took a total of 4 Xeloda tablets daily.  She reduced the dose from 5 to 4 tablets due to increased erythema at the palms and soles prior to beginning this cycle.  The erythema did not progress during or following the current cycle.  No mouth sores, nausea, or diarrhea.  She injured the left great toe on a grocery cart.  There is persistent discoloration of the toe.  Objective:  Vital signs in last 24 hours:  Blood pressure (!) 142/66, pulse 75, temperature 97.7 F (36.5 C), temperature source Tympanic, resp. rate 18, height '5\' 3"'  (1.6 m), weight 144 lb (65.3 kg), SpO2 100 %.    HEENT: No thrush or ulcers Resp: Lungs clear bilaterally Cardio: Regular rate and rhythm GI: No hepatosplenomegaly, nontender Vascular: No leg edema  Skin: Mild erythema at the distal fingers, mild erythema with skin thickening and dryness at the soles.  Black discoloration at the proximal left first toenail  Lab Results:  Lab Results  Component Value Date   WBC 6.4 12/13/2020   HGB 12.6 12/13/2020   HCT 37.4 12/13/2020   MCV 109.4 (H) 12/13/2020   PLT 220 12/13/2020   NEUTROABS 3.9 12/13/2020    CMP  Lab Results  Component Value Date   NA 140 11/23/2020   K 3.5 11/23/2020   CL 104 11/23/2020   CO2 29 11/23/2020   GLUCOSE 103 (H) 11/23/2020   BUN 16 11/23/2020   CREATININE 1.07 (H) 11/23/2020   CALCIUM 8.7 (L) 11/23/2020   PROT 6.1 (L) 11/23/2020   ALBUMIN 3.4 (L) 11/23/2020   AST 17 11/23/2020   ALT 9 11/23/2020   ALKPHOS 68 11/23/2020   BILITOT 1.1 11/23/2020   GFRNONAA 53 (L) 11/23/2020    Lab Results  Component Value Date   CEA1 1.6 07/07/2020    Medications: I have reviewed the patient's current medications.   Assessment/Plan: 1. Colon cancer, sigmoid, stage IIIb (T3N1), status post  a robotic sigmoid colectomy 07/12/2020  Colonoscopy 06/10/2020-sigmoid colon mass-invasive well differentiated adenocarcinoma, mismatch repair protein expression intact, incomplete colonoscopy?  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 1/12 lymph nodes, lymphovascular invasion present ? Intact mismatch repair protein expression ? Tumor at the distal sigmoid colon proximal to the peritoneal reflection ? Cycle 1 adjuvant Xeloda 08/15/2020 ? Cycle 2 adjuvant Xeloda 09/05/2020, discontinued after the morning dose on 09/15/2020 secondary to mouth soreness and hand/foot syndrome ? Cycle 3 adjuvant Xeloda 09/26/2020, Xeloda dose reduced to 1000 mg twice daily ? Cycle 4 adjuvant Xeloda 10/17/2020 (Xeloda 1000 mg twice daily) ? Cycle 5 adjuvant Xeloda 11/07/2020 (dose escalated to 1500 mg every morning and 1000 mg every afternoon) ? Cycle 6 adjuvant Xeloda 11/28/2020 (1500 mg every morning and 1000 mg every afternoon)-patient reduced dose to 1000 mg twice daily ? Cycle 7 adjuvant Xeloda 12/19/2020 (1500 mg every morning and 1000 mg every afternoon) 2. Hypertension 3. Family history of breast and colon cancer 4. Depression  5. Hypothyroidism 6. Oral candidiasis following surgery October 2021 7. Urinary tract infection 09/08/2020 8. Hand/foot syndrome secondary Xeloda-Xeloda dose reduced beginning with cycle 3 9. Hypokalemia secondary to diuretic therapy    Disposition: Crystal Lewis appears stable.  She has mild hand/foot syndrome.  She will dose escalate the Xeloda to 1500 mg in the morning and  1000 mg in the evening beginning with the next cycle on 12/19/2020.  Crystal Lewis will continue a potassium supplement.  She will return for an office visit in 3 weeks.  She will call for increased hand or foot erythema.  Betsy Coder, MD  12/13/2020  11:26 AM

## 2020-12-13 NOTE — Telephone Encounter (Signed)
SCHEDULED APPT PER 3/15 LOS - GAVE PATIENT avs AND CALENDER.

## 2020-12-27 ENCOUNTER — Other Ambulatory Visit (HOSPITAL_COMMUNITY): Payer: Self-pay

## 2020-12-30 ENCOUNTER — Telehealth: Payer: Self-pay | Admitting: Oncology

## 2020-12-30 NOTE — Telephone Encounter (Signed)
Called pt per 3/31 schedule message - changed appt per patient preference . Pt is aware of new appt date and time

## 2021-01-02 ENCOUNTER — Inpatient Hospital Stay: Payer: Medicare PPO | Admitting: Oncology

## 2021-01-02 ENCOUNTER — Inpatient Hospital Stay: Payer: Medicare PPO | Attending: Nurse Practitioner

## 2021-01-02 ENCOUNTER — Other Ambulatory Visit: Payer: Self-pay

## 2021-01-02 VITALS — BP 154/77 | HR 82 | Temp 97.8°F | Resp 20 | Ht 63.0 in | Wt 146.2 lb

## 2021-01-02 DIAGNOSIS — L271 Localized skin eruption due to drugs and medicaments taken internally: Secondary | ICD-10-CM | POA: Diagnosis not present

## 2021-01-02 DIAGNOSIS — C779 Secondary and unspecified malignant neoplasm of lymph node, unspecified: Secondary | ICD-10-CM | POA: Insufficient documentation

## 2021-01-02 DIAGNOSIS — C187 Malignant neoplasm of sigmoid colon: Secondary | ICD-10-CM

## 2021-01-02 DIAGNOSIS — T451X5A Adverse effect of antineoplastic and immunosuppressive drugs, initial encounter: Secondary | ICD-10-CM | POA: Diagnosis not present

## 2021-01-02 LAB — CBC WITH DIFFERENTIAL (CANCER CENTER ONLY)
Abs Immature Granulocytes: 0.02 10*3/uL (ref 0.00–0.07)
Basophils Absolute: 0.1 10*3/uL (ref 0.0–0.1)
Basophils Relative: 1 %
Eosinophils Absolute: 0.1 10*3/uL (ref 0.0–0.5)
Eosinophils Relative: 1 %
HCT: 40.2 % (ref 36.0–46.0)
Hemoglobin: 13.7 g/dL (ref 12.0–15.0)
Immature Granulocytes: 0 %
Lymphocytes Relative: 29 %
Lymphs Abs: 2 10*3/uL (ref 0.7–4.0)
MCH: 37 pg — ABNORMAL HIGH (ref 26.0–34.0)
MCHC: 34.1 g/dL (ref 30.0–36.0)
MCV: 108.6 fL — ABNORMAL HIGH (ref 80.0–100.0)
Monocytes Absolute: 0.7 10*3/uL (ref 0.1–1.0)
Monocytes Relative: 10 %
Neutro Abs: 4.1 10*3/uL (ref 1.7–7.7)
Neutrophils Relative %: 59 %
Platelet Count: 246 10*3/uL (ref 150–400)
RBC: 3.7 MIL/uL — ABNORMAL LOW (ref 3.87–5.11)
RDW: 15.5 % (ref 11.5–15.5)
WBC Count: 7 10*3/uL (ref 4.0–10.5)
nRBC: 0 % (ref 0.0–0.2)

## 2021-01-02 LAB — CMP (CANCER CENTER ONLY)
ALT: 11 U/L (ref 0–44)
AST: 17 U/L (ref 15–41)
Albumin: 3.8 g/dL (ref 3.5–5.0)
Alkaline Phosphatase: 64 U/L (ref 38–126)
Anion gap: 6 (ref 5–15)
BUN: 16 mg/dL (ref 8–23)
CO2: 30 mmol/L (ref 22–32)
Calcium: 9.4 mg/dL (ref 8.9–10.3)
Chloride: 104 mmol/L (ref 98–111)
Creatinine: 1.04 mg/dL — ABNORMAL HIGH (ref 0.44–1.00)
GFR, Estimated: 55 mL/min — ABNORMAL LOW (ref >60)
Glucose, Bld: 97 mg/dL (ref 70–99)
Potassium: 3.9 mmol/L (ref 3.5–5.1)
Sodium: 140 mmol/L (ref 135–145)
Total Bilirubin: 1.4 mg/dL — ABNORMAL HIGH (ref 0.3–1.2)
Total Protein: 6.7 g/dL (ref 6.5–8.1)

## 2021-01-02 LAB — MAGNESIUM: Magnesium: 2.1 mg/dL (ref 1.7–2.4)

## 2021-01-02 NOTE — Progress Notes (Signed)
[facility] OFFICE PROGRESS NOTE   Diagnosis: Colon cancer  INTERVAL HISTORY:   Crystal Lewis began another cycle of Xeloda on 12/19/2020.  She developed lip swelling and cracking on 12/28/2020 and did not take the p.m. dose.  She resume Xeloda 12/29/2020.  She developed erythema and pain at the soles on 01/01/2021 and did not take Xeloda that day.  No diarrhea.  No nausea.  Objective:  Vital signs in last 24 hours:  Blood pressure (!) 154/77, pulse 82, temperature 97.8 F (36.6 C), temperature source Tympanic, resp. rate 20, height 5' 3" (1.6 m), weight 146 lb 3.2 oz (66.3 kg), SpO2 100 %.    HEENT: No thrush, angular cheilitis, no oral ulcers, few small superficial ulcerations at the lips Resp: Lungs clear bilaterally Cardio: Regular rate and rhythm GI: No hepatosplenomegaly, no mass, nontender Vascular: No leg edema  Skin: Mild hyperpigmentation of the hands-no erythema, callus formation and hyperpigmentation at the soles, no erythema    Lab Results:  Lab Results  Component Value Date   WBC 7.0 01/02/2021   HGB 13.7 01/02/2021   HCT 40.2 01/02/2021   MCV 108.6 (H) 01/02/2021   PLT 246 01/02/2021   NEUTROABS 4.1 01/02/2021    CMP  Lab Results  Component Value Date   NA 140 01/02/2021   K 3.9 01/02/2021   CL 104 01/02/2021   CO2 30 01/02/2021   GLUCOSE 97 01/02/2021   BUN 16 01/02/2021   CREATININE 1.04 (H) 01/02/2021   CALCIUM 9.4 01/02/2021   PROT 6.7 01/02/2021   ALBUMIN 3.8 01/02/2021   AST 17 01/02/2021   ALT 11 01/02/2021   ALKPHOS 64 01/02/2021   BILITOT 1.4 (H) 01/02/2021   GFRNONAA 55 (L) 01/02/2021    Lab Results  Component Value Date   CEA1 1.6 07/07/2020    Lab Results  Component Value Date   INR 1.1 07/07/2020    Imaging:  No results found.  Medications: I have reviewed the patient's current medications.   Assessment/Plan: 1. Colon cancer, sigmoid, stage IIIb (T3N1), status post a robotic sigmoid colectomy  07/12/2020  Colonoscopy 06/10/2020-sigmoid colon mass-invasive well differentiated adenocarcinoma, mismatch repair protein expression intact, incomplete colonoscopy?  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 1/12 lymph nodes, lymphovascular invasion present ? Intact mismatch repair protein expression ? Tumor at the distal sigmoid colon proximal to the peritoneal reflection ? Cycle 1 adjuvant Xeloda 08/15/2020 ? Cycle 2 adjuvant Xeloda 09/05/2020, discontinued after the morning dose on 09/15/2020 secondary to mouth soreness and hand/foot syndrome ? Cycle 3 adjuvant Xeloda 09/26/2020, Xeloda dose reduced to 1000 mg twice daily ? Cycle 4 adjuvant Xeloda 10/17/2020 (Xeloda 1000 mg twice daily) ? Cycle 5 adjuvant Xeloda 11/07/2020 (dose escalated to 1500 mg every morning and 1000 mg every afternoon) ? Cycle 6 adjuvant Xeloda 11/28/2020 (1500 mg every morning and 1000 mg every afternoon)-patient reduced dose to 1000 mg twice daily ? Cycle 7 adjuvant Xeloda 12/19/2020 (1500 mg every morning and 1000 mg every afternoon), 12/29/2018 2 PM dose held secondary to lip swelling/cracking, no Xeloda on 01/01/2021 secondary to erythema and pain at the soles ?  Cycle 8 adjuvant Xeloda 01/09/2021 2. Hypertension 3. Family history of breast and colon cancer 4. Depression 5. Hypothyroidism 6. Oral candidiasis following surgery October 2021 7. Urinary tract infection 09/08/2020 8. Hand/foot syndrome secondary Xeloda-Xeloda dose reduced beginning with cycle 3 9. Hypokalemia secondary to diuretic  therapy     Disposition: Ms. Esguerra appears stable.  She has mild hand/foot syndrome and lip symptoms secondary to Xeloda.  She will resume Xeloda for a final cycle beginning 01/09/2021.  She will hold Xeloda and call for increased lip or hand/foot symptoms.  Ms. Hollingshead will return for an office visit and CEA in approximately 4 weeks.  She will need a completion colonoscopy within the next few months.  Betsy Coder, MD  01/02/2021  3:52 PM

## 2021-01-03 ENCOUNTER — Telehealth: Payer: Self-pay | Admitting: Oncology

## 2021-01-03 NOTE — Telephone Encounter (Signed)
Appointments scheduled with patient while in exam room per 4/4 los 

## 2021-01-04 ENCOUNTER — Ambulatory Visit: Payer: Medicare PPO | Admitting: Oncology

## 2021-01-04 ENCOUNTER — Other Ambulatory Visit: Payer: Medicare PPO

## 2021-01-09 ENCOUNTER — Other Ambulatory Visit (HOSPITAL_COMMUNITY): Payer: Self-pay

## 2021-01-17 ENCOUNTER — Encounter (INDEPENDENT_AMBULATORY_CARE_PROVIDER_SITE_OTHER): Payer: Self-pay

## 2021-01-27 ENCOUNTER — Telehealth: Payer: Self-pay

## 2021-01-27 NOTE — Telephone Encounter (Incomplete)
TC from Pt stating that she finished her cycle of xeloda and has felt some side effects. Pt stated she felt her feet burn for one day some nausea and diarrhea. Pt stated she has had diarrhea for 3 days and took  imodium one day. Informed Pt to continue taking the imodium and to drink gatorade

## 2021-01-30 ENCOUNTER — Other Ambulatory Visit: Payer: Self-pay

## 2021-01-30 ENCOUNTER — Encounter: Payer: Self-pay | Admitting: Nurse Practitioner

## 2021-01-30 ENCOUNTER — Inpatient Hospital Stay: Payer: Medicare PPO | Attending: Nurse Practitioner

## 2021-01-30 ENCOUNTER — Other Ambulatory Visit (HOSPITAL_COMMUNITY): Payer: Self-pay

## 2021-01-30 ENCOUNTER — Inpatient Hospital Stay: Payer: Medicare PPO | Admitting: Nurse Practitioner

## 2021-01-30 VITALS — BP 124/66 | HR 75 | Temp 98.0°F | Resp 17 | Ht 63.0 in | Wt 156.0 lb

## 2021-01-30 DIAGNOSIS — C187 Malignant neoplasm of sigmoid colon: Secondary | ICD-10-CM | POA: Diagnosis not present

## 2021-01-30 DIAGNOSIS — E876 Hypokalemia: Secondary | ICD-10-CM | POA: Insufficient documentation

## 2021-01-30 DIAGNOSIS — C779 Secondary and unspecified malignant neoplasm of lymph node, unspecified: Secondary | ICD-10-CM | POA: Insufficient documentation

## 2021-01-30 DIAGNOSIS — R197 Diarrhea, unspecified: Secondary | ICD-10-CM | POA: Diagnosis not present

## 2021-01-30 LAB — CMP (CANCER CENTER ONLY)
ALT: 10 U/L (ref 0–44)
AST: 19 U/L (ref 15–41)
Albumin: 3.8 g/dL (ref 3.5–5.0)
Alkaline Phosphatase: 70 U/L (ref 38–126)
Anion gap: 8 (ref 5–15)
BUN: 14 mg/dL (ref 8–23)
CO2: 29 mmol/L (ref 22–32)
Calcium: 9.1 mg/dL (ref 8.9–10.3)
Chloride: 104 mmol/L (ref 98–111)
Creatinine: 1.11 mg/dL — ABNORMAL HIGH (ref 0.44–1.00)
GFR, Estimated: 51 mL/min — ABNORMAL LOW (ref >60)
Glucose, Bld: 102 mg/dL — ABNORMAL HIGH (ref 70–99)
Potassium: 3.5 mmol/L (ref 3.5–5.1)
Sodium: 141 mmol/L (ref 135–145)
Total Bilirubin: 1.5 mg/dL — ABNORMAL HIGH (ref 0.3–1.2)
Total Protein: 6 g/dL — ABNORMAL LOW (ref 6.5–8.1)

## 2021-01-30 LAB — CBC WITH DIFFERENTIAL (CANCER CENTER ONLY)
Abs Immature Granulocytes: 0.01 10*3/uL (ref 0.00–0.07)
Basophils Absolute: 0.1 10*3/uL (ref 0.0–0.1)
Basophils Relative: 1 %
Eosinophils Absolute: 0.1 10*3/uL (ref 0.0–0.5)
Eosinophils Relative: 2 %
HCT: 39.2 % (ref 36.0–46.0)
Hemoglobin: 13.4 g/dL (ref 12.0–15.0)
Immature Granulocytes: 0 %
Lymphocytes Relative: 28 %
Lymphs Abs: 1.8 10*3/uL (ref 0.7–4.0)
MCH: 36.9 pg — ABNORMAL HIGH (ref 26.0–34.0)
MCHC: 34.2 g/dL (ref 30.0–36.0)
MCV: 108 fL — ABNORMAL HIGH (ref 80.0–100.0)
Monocytes Absolute: 1 10*3/uL (ref 0.1–1.0)
Monocytes Relative: 15 %
Neutro Abs: 3.4 10*3/uL (ref 1.7–7.7)
Neutrophils Relative %: 54 %
Platelet Count: 211 10*3/uL (ref 150–400)
RBC: 3.63 MIL/uL — ABNORMAL LOW (ref 3.87–5.11)
RDW: 15.8 % — ABNORMAL HIGH (ref 11.5–15.5)
WBC Count: 6.3 10*3/uL (ref 4.0–10.5)
nRBC: 0 % (ref 0.0–0.2)

## 2021-01-30 LAB — CEA (ACCESS): CEA (CHCC): 1.56 ng/mL (ref 0.00–5.00)

## 2021-01-30 NOTE — Progress Notes (Signed)
[facility] OFFICE PROGRESS NOTE   Diagnosis: Colon cancer  INTERVAL HISTORY:   Crystal Lewis returns as scheduled.  She completed the eighth and final cycle of adjuvant Xeloda beginning 01/09/2021.  She denies nausea/vomiting.  She again developed lip swelling and cracking.  She had diarrhea for several days after completing Xeloda.  She noted that feet were "burning" for 1 day.  Objective:  Vital signs in last 24 hours:  Blood pressure 124/66, pulse 75, temperature 98 F (36.7 C), temperature source Oral, resp. rate 17, height '5\' 3"'  (1.6 m), weight 156 lb (70.8 kg), SpO2 96 %.    HEENT: No thrush or ulcers. Lymphatics: No palpable cervical, supraclavicular, axillary or inguinal lymph nodes. Resp: Lungs clear bilaterally. Cardio: Regular rate and rhythm. GI: Abdomen soft and nontender.  No hepatosplenomegaly.  No mass. Vascular: No leg edema. Skin: Palms and soles with hyperpigmentation and skin thickening.  Soles with mild erythema.  No skin breakdown.   Lab Results:  Lab Results  Component Value Date   WBC 6.3 01/30/2021   HGB 13.4 01/30/2021   HCT 39.2 01/30/2021   MCV 108.0 (H) 01/30/2021   PLT 211 01/30/2021   NEUTROABS 3.4 01/30/2021    Imaging:  No results found.  Medications: I have reviewed the patient's current medications.  Assessment/Plan: 1. Colon cancer, sigmoid, stage IIIb (T3N1), status post a robotic sigmoid colectomy 07/12/2020  Colonoscopy 06/10/2020-sigmoid colon mass-invasive well differentiated adenocarcinoma, mismatch repair protein expression intact, incomplete colonoscopy?  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 1/12 lymph nodes, lymphovascular invasion present ? Intact mismatch repair protein expression ? Tumor at the distal sigmoid colon proximal to the peritoneal reflection ? Cycle 1 adjuvant Xeloda 08/15/2020 ? Cycle 2 adjuvant Xeloda 09/05/2020, discontinued after the morning dose on 09/15/2020 secondary to mouth soreness and hand/foot syndrome ? Cycle 3 adjuvant Xeloda 09/26/2020, Xeloda dose reduced to 1000 mg twice daily ? Cycle 4 adjuvant Xeloda 10/17/2020 (Xeloda 1000 mg twice daily) ? Cycle 5 adjuvant Xeloda 11/07/2020 (dose escalated to 1500 mg every morning and 1000 mg every afternoon) ? Cycle 6 adjuvant Xeloda 11/28/2020 (1500 mg every morning and 1000 mg every afternoon)-patient reduced dose to 1000 mg twice daily ? Cycle 7 adjuvant Xeloda 12/19/2020 (1500 mg every morning and 1000 mg every afternoon), 12/29/2018 2 PM dose held secondary to lip swelling/cracking, no Xeloda on 01/01/2021 secondary to erythema and pain at the soles ? Cycle 8 adjuvant Xeloda 01/09/2021 2. Hypertension 3. Family history of breast and colon cancer 4. Depression 5. Hypothyroidism 6. Oral candidiasis following surgery October 2021 7. Urinary tract infection 09/08/2020 8. Hand/foot syndrome secondary Xeloda-Xeloda dose reduced beginning with cycle 3 9. Hypokalemia secondary to diuretic therapy   Disposition: Crystal Lewis appears stable.  She has completed the course of adjuvant chemotherapy.  I made a referral to Dr. Rolm Bookbinder for a colonoscopy.  She will undergo surveillance CT scans in approximately 3 months.  We will see her back a few days after the scans to review the results.  She will contact the office in the interim with any problems.    Ned Card  ANP/GNP-BC   01/30/2021  2:28 PM

## 2021-02-03 LAB — CEA (IN HOUSE-CHCC): CEA (CHCC-In House): 1.83 ng/mL (ref 0.00–5.00)

## 2021-02-06 ENCOUNTER — Encounter: Payer: Self-pay | Admitting: *Deleted

## 2021-02-06 NOTE — Progress Notes (Signed)
Faxed recent office note and referral order to Dr. Margaretmary Bayley [phone] requesting f/u visit. Needs colonoscopy.

## 2021-02-14 ENCOUNTER — Encounter: Payer: Self-pay | Admitting: Nurse Practitioner

## 2021-02-23 ENCOUNTER — Ambulatory Visit (INDEPENDENT_AMBULATORY_CARE_PROVIDER_SITE_OTHER): Payer: Medicare PPO | Admitting: Ophthalmology

## 2021-02-23 ENCOUNTER — Encounter (INDEPENDENT_AMBULATORY_CARE_PROVIDER_SITE_OTHER): Payer: Self-pay | Admitting: Ophthalmology

## 2021-02-23 ENCOUNTER — Other Ambulatory Visit: Payer: Self-pay

## 2021-02-23 DIAGNOSIS — H353132 Nonexudative age-related macular degeneration, bilateral, intermediate dry stage: Secondary | ICD-10-CM | POA: Diagnosis not present

## 2021-02-23 DIAGNOSIS — H2513 Age-related nuclear cataract, bilateral: Secondary | ICD-10-CM | POA: Diagnosis not present

## 2021-02-23 NOTE — Patient Instructions (Signed)
Age-Related Macular Degeneration  Age-related macular degeneration (AMD) is an eye disease related to aging. The disease causes a loss of central vision. Central vision allows a person to see objects clearly and do daily tasks like reading and driving. There are two main types of AMD:  Dry AMD. People with this type generally lose their vision slowly. This is the most common type of AMD. Some people with dry AMD notice very little change in their vision as they age.  Wet AMD. People with this type can lose their vision quickly. What are the causes? This condition is caused by damage to the part of the eye that provides you with central vision (macula).  Dry AMD happens when deposits in the macula cause light-sensitive cells to slowly break down.  Wet AMD happens when abnormal blood vessels grow under the macula and leak blood and fluid. What increases the risk? You are more likely to develop this condition if you:  Are 50 years old or older, and especially 75 years old or older.  Smoke.  Are obese.  Have a family history of AMD.  Have high cholesterol, high blood pressure, or heart disease.  Have been exposed to high levels of ultraviolet (UV) light and blue light.  Are white (Caucasian).  Are female. What are the signs or symptoms? Common symptoms of this condition include:  Blurred vision, especially when reading print material. The blurred vision often improves in brighter light.  A blurred or blind spot in the center of your field of vision that is small but growing larger.  Bright colors seeming less bright than they used to be.  Decreased ability to recognize and see faces.  One eye seeing worse than the other.  Decreased ability to adapt to dimly lit rooms.  Straight lines appearing crooked or wavy. How is this diagnosed? This condition is diagnosed based on your symptoms and an eye exam. During the eye exam:  Eye drops will be placed into your eyes to  enlarge (dilate) your pupils. This will allow your health care provider to see the back of your eye.  You may be asked to look at an image that looks like a checkerboard (Amsler grid). Early changes in your central vision may cause the grid to appear distorted. After the exam, you may be given one or both of these tests:  Fluorescein angiogram. This test determines whether you have dry or wet AMD.  Optical coherence tomography (OCT) test to evaluate deep layers of the retina. How is this treated? There is no cure for this condition, but treatment can help to slow down progression of the disease. This condition may be treated with:  Supplements, including vitamin C, vitamin E, beta carotene, and zinc.  Laser surgery to destroy new blood vessels or leaking blood vessels in your eye.  Injections of medicines into your eye to slow down the formation of abnormal blood vessels that may leak. These injections may need to be repeated on a routine basis. Follow these instructions at home:  Take over-the-counter and prescription medicines only as told by your health care provider.  Take vitamins and supplements as told by your health care provider.  Ask your health care provider for an Amsler grid. Use it every day to check each eye for vision changes.  Get an eye exam as often as told by your health care provider. Make sure to get an eye exam at least once every year.  Keep all follow-up visits as told by   your health care provider. This is important. Contact a health care provider if:  You notice any new changes in your vision. Get help right away if:  You suddenly lose vision or develop pain in the eye. Summary  Age-related macular degeneration (AMD) is an eye disease related to aging. There are two types of this condition: dry AMD and wet AMD.  This condition is caused by damage to the part of the eye that provides you with central vision (macula).  Once diagnosed with AMD, make sure  to get an eye exam every year, take supplements and vitamins as directed, use an Amsler grid at home, and follow up with your health care provider. This information is not intended to replace advice given to you by your health care provider. Make sure you discuss any questions you have with your health care provider. Document Revised: 03/26/2018 Document Reviewed: 03/26/2018 Elsevier Patient Education  [employer].

## 2021-02-23 NOTE — Assessment & Plan Note (Signed)
The nature of age--related macular degeneration was discussed with the patient as well as the distinction between dry and wet types. Checking an Amsler Grid daily with advice to return immediately should a distortion develop, was given to the patient. The patient 's smoking status now and in the past was determined and advice based on the AREDS study was provided regarding the consumption of antioxidant supplements. AREDS 2 vitamin formulation was recommended. Consumption of dark leafy vegetables and fresh fruits of various colors was recommended. Treatment modalities for wet macular degeneration particularly the use of intravitreal injections of anti-blood vessel growth factors was discussed with the patient. Avastin, Lucentis, and Eylea are the available options. On occasion, therapy includes the use of photodynamic therapy and thermal laser. Stressed to the patient do not rub eyes.  Patient was advised to check Amsler Grid daily and return immediately if changes are noted. Instructions on using the grid were given to the patient. All patient questions were answered.  The nature of age realated macular degeneration (ARMD)is explained as follows: The dry form refers to the progressive loss of normal blood supply to the central vision as a result of a combination of factors which include aging blood supply (arteriosclerosis, hardening of the arteries), genetics, smoking habits, and history of hypertension. Currently, no eye medications or vitamins slow this type of aging effect upon vision, however cessation of smoking and controlling hypertension help slow the disorder. The following analogy helps explain this: I describe the dry form of ARMD like a house of the same age as your eyes, which shows typical wear and tear of age upon the house structure and appearance. Like the aging house which can fall down structurally, the dry form of ARMD can deteriorate the structure of the macula (center) of the retina, most  often gradually and affect central vision tasks such as reading and driving. The wet form of ARMD refers to the development of abnormally growing blood vessels usually near or under the central vision, with potential risk of permanent visual changes or vision losses. This complication of the Dry form of ARMD may be moderately reduced by use of AREDS 2 formula multivitamins daily. I describe the Wet form of ARMD as like the development of a fire in an aging house (DRY ARMD). It may develop suddenly, progress rapidly and be destructive based on where it starts and how big the fire is when found. In the eye, the house fire analogy refers to the abnormal blood vessels growing destructively near the central vison in the retina, or film of the eye. Halting the growth of blood vessels with laser (hot or cold) or injectable medications is best way "to put the fire out". Many patients will experience a stabilization or even improvement in vision with a treatment course, while others may still face a loss of vision. The use of injectable medications has revolutionized therapy and is currently the only proven therapy to provide the chance of stable or improved acuity from new and recent destructive wet ARMD.

## 2021-02-23 NOTE — Progress Notes (Signed)
02/23/2021     CHIEF COMPLAINT Patient presents for Retina Evaluation (NP- here for AMD OU- referred by Dr. Alfonso Patten. Groat/Pt states, "I was told about 2 years ago that I have ARMD and Dr. Katy Fitch wanted me to come and get an evaluation because there have been some changes OS. "/PT states that she takes AREDS vitamins.)   HISTORY OF PRESENT ILLNESS: Crystal Lewis is a 80 y.o. female who presents to the clinic today for:   HPI    Retina Evaluation    Laterality: both eyes   Comments: NP- here for AMD OU- referred by Dr. Elliot Dally Pt states, "I was told about 2 years ago that I have ARMD and Dr. Katy Fitch wanted me to come and get an evaluation because there have been some changes OS. " PT states that she takes AREDS vitamins.       Last edited by Kendra Opitz, COA on 02/23/2021 10:00 AM. (History)      Referring physician: Merry Lofty, [place],  [place] [address]  HISTORICAL INFORMATION:   Selected notes from the MEDICAL RECORD NUMBER    Lab Results  Component Value Date   HGBA1C 5.7 (H) 07/07/2020     CURRENT MEDICATIONS: No current outpatient medications on file. (Ophthalmic Drugs)   No current facility-administered medications for this visit. (Ophthalmic Drugs)   Current Outpatient Medications (Other)  Medication Sig  . atenolol-chlorthalidone (TENORETIC) 50-25 MG per tablet Take 1 tablet by mouth daily.  . Biotin 5000 MCG TABS Take 5,000 mcg by mouth daily.  (Patient not taking: No sig reported)  . buPROPion (WELLBUTRIN SR) 150 MG 12 hr tablet Take 150 mg by mouth daily.   . Calcium Carbonate-Vitamin D (CALCIUM-D PO) Take 1 tablet by mouth daily. Plus minerals  . fluocinonide (LIDEX) 0.05 % external solution Apply 1 application topically See admin instructions. 4 times a year  . Ivermectin 1 % CREA Apply topically daily.  Marland Kitchen levothyroxine (SYNTHROID, LEVOTHROID) 100 MCG tablet Take 100 mcg by mouth daily. Must be the brand Name  . liothyronine  (CYTOMEL) 5 MCG tablet Take 1 tablet (5 mcg total) by mouth daily.  . magic mouthwash SOLN Take 5-10 mLs by mouth 4 (four) times daily as needed for mouth pain. Swish and spit.  . Misc Natural Products (OSTEO BI-FLEX ADV TRIPLE ST PO) Take 1 tablet by mouth in the morning and at bedtime.  . Multiple Vitamins-Minerals (PRESERVISION AREDS PO) Take 1 capsule by mouth in the morning and at bedtime.  . Multiple Vitamins-Minerals (ZINC PO) Take 1 tablet by mouth daily. (Patient not taking: No sig reported)  . NON FORMULARY Apply 1 application topically as needed (Hormones). Estriol/testosterone cream   (Patient not taking: No sig reported)  . ondansetron (ZOFRAN) 8 MG tablet Take 1 tablet (8 mg total) by mouth 2 (two) times daily as needed for nausea or vomiting. (Patient not taking: No sig reported)  . Polyethylene Glycol 3350 (MIRALAX PO) Take 17 g by mouth daily. (Patient not taking: No sig reported)  . potassium chloride (MICRO-K) 10 MEQ CR capsule Take 2 capsules (20 mEq total) by mouth 2 (two) times daily.  Marland Kitchen PREMARIN vaginal cream Place 1 Applicatorful vaginally every 30 (thirty) days.  . prochlorperazine (COMPAZINE) 5 MG tablet Take 1-2 tablets (5-10 mg total) by mouth every 6 (six) hours as needed for nausea or vomiting. (Patient not taking: No sig reported)  . raloxifene (EVISTA) 60 MG tablet Take 1 tablet (  60 mg total) by mouth daily.   No current facility-administered medications for this visit. (Other)      REVIEW OF SYSTEMS:    ALLERGIES Allergies  Allergen Reactions  . Adhesive [Tape] Other (See Comments)    Leave on to long cause skin to peel off with tape  . Sulfa Antibiotics Rash    PAST MEDICAL HISTORY Past Medical History:  Diagnosis Date  . Allergy   . Atrophic vaginitis   . Depression   . Hypertension   . Hypothyroidism   . Low bone mass   . Osteoporosis   . Thyroid disease    Hypothyroid   Past Surgical History:  Procedure Laterality Date  . COSMETIC  SURGERY    . FOOT SURGERY    . TONSILLECTOMY    . TUBAL LIGATION      FAMILY HISTORY Family History  Problem Relation Age of Onset  . Breast cancer Mother        Age 109  . Hypertension Mother   . Cancer Mother   . Mental illness Mother   . Mental illness Maternal Grandmother   . Cancer Maternal Grandfather   . Heart disease Paternal Grandmother   . Hypertension Son   . Breast cancer Cousin        Female 53st Mat cousin age 40    SOCIAL HISTORY Social History   Tobacco Use  . Smoking status: Never Smoker  . Smokeless tobacco: Never Used  Substance Use Topics  . Alcohol use: Yes    Comment: rare  . Drug use: No         OPHTHALMIC EXAM:  Base Eye Exam    Visual Acuity (ETDRS)      Right Left   Dist cc 20/25 +2 20/40   Dist ph cc  20/30 -1   Correction: Glasses       Tonometry (Tonopen, 10:04 AM)      Right Left   Pressure 17 18       Pupils      Pupils Dark Light Shape React APD   Right PERRL 3 2 Round Minimal None   Left PERRL 3 2 Round Minimal None       Visual Fields (Counting fingers)      Left Right    Full Full       Extraocular Movement      Right Left    Full Full       Neuro/Psych    Oriented x3: Yes       Dilation    Both eyes: 1.0% Mydriacyl, 2.5% Phenylephrine @ 10:04 AM        Slit Lamp and Fundus Exam    External Exam      Right Left   External Normal Normal       Slit Lamp Exam      Right Left   Lids/Lashes Normal Normal   Conjunctiva/Sclera White and quiet White and quiet   Cornea Clear Clear   Anterior Chamber Deep and quiet Deep and quiet   Iris Round and reactive Round and reactive   Lens 2+ Nuclear sclerosis 2+ Nuclear sclerosis   Anterior Vitreous Normal Normal       Fundus Exam      Right Left   Posterior Vitreous Normal Normal   Disc Normal Normal   C/D Ratio 0.1    Macula Soft drusen, Intermediate age related macular degeneration Soft drusen, Intermediate age related macular degeneration   Vessels  Normal  Normal   Periphery Normal Normal          IMAGING AND PROCEDURES  Imaging and Procedures for 02/23/21  OCT, Retina - OU - Both Eyes       Right Eye Quality was good. Scan locations included subfoveal. Central Foveal Thickness: 297. Findings include retinal drusen , no IRF, no SRF.   Left Eye Quality was good. Scan locations included subfoveal. Central Foveal Thickness: 303. Findings include retinal drusen , no IRF, no SRF.                 ASSESSMENT/PLAN:  Nuclear sclerotic cataract of both eyes The nature of cataract was discussed with the patient as well as the elective nature of surgery. The patient was reassured that surgery at a later date does not put the patient at risk for a worse outcome. It was emphasized that the need for surgery is dictated by the patient's quality of life as influenced by the cataract. Patient was instructed to maintain close follow up with their general eye care doctor.  Intermediate stage nonexudative age-related macular degeneration of both eyes The nature of age--related macular degeneration was discussed with the patient as well as the distinction between dry and wet types. Checking an Amsler Grid daily with advice to return immediately should a distortion develop, was given to the patient. The patient 's smoking status now and in the past was determined and advice based on the AREDS study was provided regarding the consumption of antioxidant supplements. AREDS 2 vitamin formulation was recommended. Consumption of dark leafy vegetables and fresh fruits of various colors was recommended. Treatment modalities for wet macular degeneration particularly the use of intravitreal injections of anti-blood vessel growth factors was discussed with the patient. Avastin, Lucentis, and Eylea are the available options. On occasion, therapy includes the use of photodynamic therapy and thermal laser. Stressed to the patient do not rub eyes.  Patient was  advised to check Amsler Grid daily and return immediately if changes are noted. Instructions on using the grid were given to the patient. All patient questions were answered.  The nature of age realated macular degeneration (ARMD)is explained as follows: The dry form refers to the progressive loss of normal blood supply to the central vision as a result of a combination of factors which include aging blood supply (arteriosclerosis, hardening of the arteries), genetics, smoking habits, and history of hypertension. Currently, no eye medications or vitamins slow this type of aging effect upon vision, however cessation of smoking and controlling hypertension help slow the disorder. The following analogy helps explain this: I describe the dry form of ARMD like a house of the same age as your eyes, which shows typical wear and tear of age upon the house structure and appearance. Like the aging house which can fall down structurally, the dry form of ARMD can deteriorate the structure of the macula (center) of the retina, most often gradually and affect central vision tasks such as reading and driving. The wet form of ARMD refers to the development of abnormally growing blood vessels usually near or under the central vision, with potential risk of permanent visual changes or vision losses. This complication of the Dry form of ARMD may be moderately reduced by use of AREDS 2 formula multivitamins daily. I describe the Wet form of ARMD as like the development of a fire in an aging house (DRY ARMD). It may develop suddenly, progress rapidly and be destructive based on where it starts and how big  the fire is when found. In the eye, the house fire analogy refers to the abnormal blood vessels growing destructively near the central vison in the retina, or film of the eye. Halting the growth of blood vessels with laser (hot or cold) or injectable medications is best way "to put the fire out". Many patients will experience a  stabilization or even improvement in vision with a treatment course, while others may still face a loss of vision. The use of injectable medications has revolutionized therapy and is currently the only proven therapy to provide the chance of stable or improved acuity from new and recent destructive wet ARMD.      ICD-10-CM   1. Intermediate stage nonexudative age-related macular degeneration of both eyes  H35.3132 OCT, Retina - OU - Both Eyes  2. Nuclear sclerotic cataract of both eyes  H25.13     1.  Patient has intermediate age-related macular degeneration each eye with no signs of complications  We will review with the patient the continued use of AREDS 2 formulation avoidance of extensive excessive doses of vitamin A because a known toxicity to the aging retina particularly the macula in his age group.  2.  Patient has nuclear sclerotic cataracts which I explained to the patient is like having dark yellow-green sunglasses which would affect nighttime vision or daytime vision on dark rainy days or requiring brighter lights.  She indicated a clear understanding of these issues and indicated also some early symptoms relative to this  3.  Follow-up with Dr. Carolynn Sayers or [facility] eye care as scheduled.  I reassured her that Dr. Nicki Reaper and Dr. Gerald Stabs as they are known or terrific physicians  Ophthalmic Meds Ordered this visit:  No orders of the defined types were placed in this encounter.      Return in about 1 year (around 02/23/2022) for DILATE OU, OCT.  Patient Instructions  Age-Related Macular Degeneration  Age-related macular degeneration (AMD) is an eye disease related to aging. The disease causes a loss of central vision. Central vision allows a person to see objects clearly and do daily tasks like reading and driving. There are two main types of AMD:  Dry AMD. People with this type generally lose their vision slowly. This is the most common type of AMD. Some people with dry AMD notice very  little change in their vision as they age.  Wet AMD. People with this type can lose their vision quickly. What are the causes? This condition is caused by damage to the part of the eye that provides you with central vision (macula).  Dry AMD happens when deposits in the macula cause light-sensitive cells to slowly break down.  Wet AMD happens when abnormal blood vessels grow under the macula and leak blood and fluid. What increases the risk? You are more likely to develop this condition if you:  Are 6 years old or older, and especially 53 years old or older.  Smoke.  Are obese.  Have a family history of AMD.  Have high cholesterol, high blood pressure, or heart disease.  Have been exposed to high levels of ultraviolet (UV) light and blue light.  Are white (Caucasian).  Are female. What are the signs or symptoms? Common symptoms of this condition include:  Blurred vision, especially when reading print material. The blurred vision often improves in brighter light.  A blurred or blind spot in the center of your field of vision that is small but growing larger.  Bright colors seeming less  bright than they used to be.  Decreased ability to recognize and see faces.  One eye seeing worse than the other.  Decreased ability to adapt to dimly lit rooms.  Straight lines appearing crooked or wavy. How is this diagnosed? This condition is diagnosed based on your symptoms and an eye exam. During the eye exam:  Eye drops will be placed into your eyes to enlarge (dilate) your pupils. This will allow your health care provider to see the back of your eye.  You may be asked to look at an image that looks like a checkerboard (Amsler grid). Early changes in your central vision may cause the grid to appear distorted. After the exam, you may be given one or both of these tests:  Fluorescein angiogram. This test determines whether you have dry or wet AMD.  Optical coherence tomography  (OCT) test to evaluate deep layers of the retina. How is this treated? There is no cure for this condition, but treatment can help to slow down progression of the disease. This condition may be treated with:  Supplements, including vitamin C, vitamin E, beta carotene, and zinc.  Laser surgery to destroy new blood vessels or leaking blood vessels in your eye.  Injections of medicines into your eye to slow down the formation of abnormal blood vessels that may leak. These injections may need to be repeated on a routine basis. Follow these instructions at home:  Take over-the-counter and prescription medicines only as told by your health care provider.  Take vitamins and supplements as told by your health care provider.  Ask your health care provider for an Amsler grid. Use it every day to check each eye for vision changes.  Get an eye exam as often as told by your health care provider. Make sure to get an eye exam at least once every year.  Keep all follow-up visits as told by your health care provider. This is important. Contact a health care provider if:  You notice any new changes in your vision. Get help right away if:  You suddenly lose vision or develop pain in the eye. Summary  Age-related macular degeneration (AMD) is an eye disease related to aging. There are two types of this condition: dry AMD and wet AMD.  This condition is caused by damage to the part of the eye that provides you with central vision (macula).  Once diagnosed with AMD, make sure to get an eye exam every year, take supplements and vitamins as directed, use an Amsler grid at home, and follow up with your health care provider. This information is not intended to replace advice given to you by your health care provider. Make sure you discuss any questions you have with your health care provider. Document Revised: 03/26/2018 Document Reviewed: 03/26/2018 Elsevier Patient Education  2021 [place] the diagnoses, plan, and follow up with the patient and they expressed understanding.  Patient expressed understanding of the importance of proper follow up care.   Clent Demark Jeananne Bedwell M.D. Diseases & Surgery of the Retina and Vitreous [facility] [place] 02/23/21     Abbreviations: M myopia (nearsighted); A astigmatism; H hyperopia (farsighted); P presbyopia; Mrx spectacle prescription;  CTL contact lenses; OD right eye; OS left eye; OU both eyes  XT exotropia; ET esotropia; PEK punctate epithelial keratitis; PEE punctate epithelial erosions; DES dry eye syndrome; MGD meibomian gland dysfunction; ATs artificial tears; PFAT's preservative free artificial tears; [place] nuclear sclerotic cataract;  PSC posterior subcapsular cataract; ERM epi-retinal membrane; PVD posterior vitreous detachment; RD retinal detachment; DM diabetes mellitus; DR diabetic retinopathy; NPDR non-proliferative diabetic retinopathy; PDR proliferative diabetic retinopathy; CSME clinically significant macular edema; DME diabetic macular edema; dbh dot blot hemorrhages; CWS cotton wool spot; POAG primary open angle glaucoma; C/D cup-to-disc ratio; HVF humphrey visual field; GVF goldmann visual field; OCT optical coherence tomography; IOP intraocular pressure; BRVO Branch retinal vein occlusion; CRVO central retinal vein occlusion; CRAO central retinal artery occlusion; BRAO branch retinal artery occlusion; RT retinal tear; SB scleral buckle; PPV pars plana vitrectomy; VH Vitreous hemorrhage; PRP panretinal laser photocoagulation; IVK intravitreal kenalog; VMT vitreomacular traction; MH Macular hole;  NVD neovascularization of the disc; NVE neovascularization elsewhere; AREDS age related eye disease study; ARMD age related macular degeneration; POAG primary open angle glaucoma; EBMD epithelial/anterior basement membrane dystrophy; ACIOL anterior chamber intraocular lens; IOL intraocular lens; PCIOL posterior  chamber intraocular lens; Phaco/IOL phacoemulsification with intraocular lens placement; [place] photorefractive keratectomy; LASIK laser assisted in situ keratomileusis; HTN hypertension; DM diabetes mellitus; COPD chronic obstructive pulmonary disease

## 2021-02-23 NOTE — Assessment & Plan Note (Signed)

## 2021-04-05 ENCOUNTER — Telehealth: Payer: Self-pay | Admitting: *Deleted

## 2021-04-05 NOTE — Telephone Encounter (Signed)
Called patient w/CT scan appointment for 05/02/21 at 1000 w/arrival at 0930. Drink contrast at 0800 and 0900. She will pick up contrast few days prior. Will be NPO for 4 hours prior to scan as well. She will come in as scheduled for labs at 0900. Managed care made aware.

## 2021-05-02 ENCOUNTER — Ambulatory Visit (HOSPITAL_BASED_OUTPATIENT_CLINIC_OR_DEPARTMENT_OTHER)
Admission: RE | Admit: 2021-05-02 | Discharge: 2021-05-02 | Disposition: A | Discharge status: Home / Self Care | Payer: Medicare PPO | Source: Ambulatory Visit | Attending: Nurse Practitioner | Admitting: Nurse Practitioner

## 2021-05-02 ENCOUNTER — Inpatient Hospital Stay: Payer: Medicare PPO | Attending: Nurse Practitioner

## 2021-05-02 ENCOUNTER — Other Ambulatory Visit: Payer: Self-pay

## 2021-05-02 DIAGNOSIS — C187 Malignant neoplasm of sigmoid colon: Secondary | ICD-10-CM | POA: Insufficient documentation

## 2021-05-02 DIAGNOSIS — E876 Hypokalemia: Secondary | ICD-10-CM | POA: Diagnosis not present

## 2021-05-02 DIAGNOSIS — E039 Hypothyroidism, unspecified: Secondary | ICD-10-CM | POA: Diagnosis not present

## 2021-05-02 LAB — CMP (CANCER CENTER ONLY)
ALT: 10 U/L (ref 0–44)
AST: 17 U/L (ref 15–41)
Albumin: 3.8 g/dL (ref 3.5–5.0)
Alkaline Phosphatase: 64 U/L (ref 38–126)
Anion gap: 7 (ref 5–15)
BUN: 18 mg/dL (ref 8–23)
CO2: 31 mmol/L (ref 22–32)
Calcium: 8.8 mg/dL — ABNORMAL LOW (ref 8.9–10.3)
Chloride: 102 mmol/L (ref 98–111)
Creatinine: 0.97 mg/dL (ref 0.44–1.00)
GFR, Estimated: 60 mL/min — ABNORMAL LOW (ref >60)
Glucose, Bld: 83 mg/dL (ref 70–99)
Potassium: 3.4 mmol/L — ABNORMAL LOW (ref 3.5–5.1)
Sodium: 140 mmol/L (ref 135–145)
Total Bilirubin: 0.7 mg/dL (ref 0.3–1.2)
Total Protein: 6.7 g/dL (ref 6.5–8.1)

## 2021-05-02 LAB — CEA (ACCESS): CEA (CHCC): <1 ng/mL (ref 0.00–5.00)

## 2021-05-02 MED ORDER — IOHEXOL 300 MG/ML  SOLN
75.0000 mL | Freq: Once | INTRAMUSCULAR | Status: AC | PRN
  Administered 2021-05-02: 75 mL via INTRAVENOUS

## 2021-05-03 ENCOUNTER — Telehealth: Payer: Self-pay | Admitting: *Deleted

## 2021-05-03 LAB — CEA (IN HOUSE-CHCC): CEA (CHCC-In House): 1.36 ng/mL (ref 0.00–5.00)

## 2021-05-03 NOTE — Telephone Encounter (Signed)
Called patient to f/u on her call to [facility] during the night for diarrhea x 20 since am of 05/02/21. Patient reports the diarrhea has stopped and she is hydrating without difficulty. Thinks it was a severe reaction to the barium for her CT scan. Will f/u as scheduled on 8/4.

## 2021-05-04 ENCOUNTER — Other Ambulatory Visit: Payer: Self-pay

## 2021-05-04 ENCOUNTER — Inpatient Hospital Stay: Payer: Medicare PPO | Admitting: Oncology

## 2021-05-04 VITALS — BP 125/68 | HR 72 | Temp 97.8°F | Resp 18 | Ht 63.0 in | Wt 144.6 lb

## 2021-05-04 DIAGNOSIS — C187 Malignant neoplasm of sigmoid colon: Secondary | ICD-10-CM

## 2021-05-04 NOTE — Progress Notes (Signed)
[facility] OFFICE PROGRESS NOTE   Diagnosis: Colon cancer  INTERVAL HISTORY:   Crystal Lewis returns as scheduled.  She feels well.  Good appetite.  She has noted persistent darkening at several toenails since completing chemotherapy.  She feels she may have "chemo brain "as she has difficulty with word finding.  Objective:  Vital signs in last 24 hours:  Blood pressure 125/68, pulse 72, temperature 97.8 F (36.6 C), temperature source Oral, resp. rate 18, height '5\' 3"'  (1.6 m), weight 144 lb 9.6 oz (65.6 kg), SpO2 98 %.    Lymphatics: No cervical, supraclavicular, axillary, or inguinal nodes Resp: Lungs clear bilaterally Cardio: Regular rate and rhythm GI: No hepatosplenomegaly, nontender Vascular: No leg edema  Skin: Dark brown discoloration at the left first toenail, brown discoloration at the left fifth toenail  Lab Results:  Lab Results  Component Value Date   WBC 6.3 01/30/2021   HGB 13.4 01/30/2021   HCT 39.2 01/30/2021   MCV 108.0 (H) 01/30/2021   PLT 211 01/30/2021   NEUTROABS 3.4 01/30/2021    CMP  Lab Results  Component Value Date   NA 140 05/02/2021   K 3.4 (L) 05/02/2021   CL 102 05/02/2021   CO2 31 05/02/2021   GLUCOSE 83 05/02/2021   BUN 18 05/02/2021   CREATININE 0.97 05/02/2021   CALCIUM 8.8 (L) 05/02/2021   PROT 6.7 05/02/2021   ALBUMIN 3.8 05/02/2021   AST 17 05/02/2021   ALT 10 05/02/2021   ALKPHOS 64 05/02/2021   BILITOT 0.7 05/02/2021   GFRNONAA 60 (L) 05/02/2021    Lab Results  Component Value Date   CEA1 1.36 05/02/2021   CEA <1.00 05/02/2021      Imaging:  CT CHEST ABDOMEN PELVIS W CONTRAST  Result Date: 05/03/2021 CLINICAL DATA:  Sigmoid colon cancer surveillance, status post chemotherapy and sigmoid colon resection EXAM: CT CHEST, ABDOMEN, AND PELVIS WITH CONTRAST TECHNIQUE: Multidetector CT imaging of the chest, abdomen and pelvis was performed following the standard protocol during bolus administration of  intravenous contrast. CONTRAST:  28m OMNIPAQUE IOHEXOL 300 MG/ML SOLN, additional oral enteric contrast COMPARISON:  CT chest, 08/03/2020, outside MR abdomen, 06/22/2020 FINDINGS: CT CHEST FINDINGS Cardiovascular: Scattered aortic atherosclerosis. Normal heart size. No pericardial effusion. Mediastinum/Nodes: No enlarged mediastinal, hilar, or axillary lymph nodes. Thyroid gland, trachea, and esophagus demonstrate no significant findings. Lungs/Pleura: Lungs are clear. No pleural effusion or pneumothorax. Musculoskeletal: No chest wall mass or suspicious bone lesions identified. CT ABDOMEN PELVIS FINDINGS Hepatobiliary: No solid liver abnormality is seen. Unchanged fluid attenuation lesions of the left lobe of the liver, previously characterized as cysts by prior MR. No gallstones, gallbladder wall thickening, or biliary dilatation. Pancreas: Unremarkable. No pancreatic ductal dilatation or surrounding inflammatory changes. Spleen: Normal in size without significant abnormality. Adrenals/Urinary Tract: Adrenal glands are unremarkable. Kidneys are normal, without renal calculi, solid lesion, or hydronephrosis. Bladder is unremarkable. Stomach/Bowel: Stomach is within normal limits. Appendix appears normal. No evidence of bowel wall thickening, distention, or inflammatory changes. Redemonstrated postoperative findings of sigmoid colon resection and reanastomosis. Vascular/Lymphatic: Aortic atherosclerosis. No enlarged abdominal or pelvic lymph nodes. Reproductive: No mass or other abnormality. Other: No abdominal wall hernia or abnormality. No abdominopelvic ascites. Musculoskeletal: No acute or significant osseous findings. IMPRESSION: 1. Redemonstrated postoperative findings of sigmoid colon resection and reanastomosis. 2. No evidence of recurrent or metastatic disease in the chest, abdomen, or pelvis. Aortic Atherosclerosis (ICD10-I70.0). Electronically Signed   By: AEddie CandleM.D.   On: 05/03/2021  08:52     Medications: I have reviewed the patient's current medications.   Assessment/Plan: Colon cancer, sigmoid, stage IIIb (T3N1), status post a robotic sigmoid colectomy 07/12/2020         Colonoscopy 06/10/2020-sigmoid colon mass-invasive well differentiated adenocarcinoma, mismatch repair protein expression intact, incomplete colonoscopy? 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 Robotic sigmoid colectomy 07/12/2020 1/12 lymph nodes, lymphovascular invasion present Intact mismatch repair protein expression Tumor at the distal sigmoid colon proximal to the peritoneal reflection CT chest 08/03/2020-no evidence of metastatic disease Cycle 1 adjuvant Xeloda 08/15/2020 Cycle 2 adjuvant Xeloda 09/05/2020, discontinued after the morning dose on 09/15/2020 secondary to mouth soreness and hand/foot syndrome Cycle 3 adjuvant Xeloda 09/26/2020, Xeloda dose reduced to 1000 mg twice daily Cycle 4 adjuvant Xeloda 10/17/2020 (Xeloda 1000 mg twice daily) Cycle 5 adjuvant Xeloda 11/07/2020 (dose escalated to 1500 mg every morning and 1000 mg every afternoon) Cycle 6 adjuvant Xeloda 11/28/2020 (1500 mg every morning and 1000 mg every afternoon)-patient reduced dose to 1000 mg twice daily Cycle 7 adjuvant Xeloda 12/19/2020 (1500 mg every morning and 1000 mg every afternoon), 12/29/2018 2 PM dose held secondary to lip swelling/cracking, no Xeloda on 01/01/2021 secondary to erythema and pain at the soles Cycle 8 adjuvant Xeloda 01/09/2021 CTs 05/02/2021-no evidence of recurrent disease Hypertension Family history of  breast and colon cancer Depression Hypothyroidism Oral candidiasis following surgery October 2021 Urinary tract infection 09/08/2020 Hand/foot syndrome secondary Xeloda-Xeloda dose reduced beginning with cycle 3 Hypokalemia secondary to diuretic therapy       Disposition: Crystal Lewis is in clinical remission from colon cancer.  She will return for an office visit and CEA in 6 months.  She will seek medical attention if the discoloration at the left first toenail does not improve.  She plans to schedule a surveillance colonoscopy with Dr. Rolm Bookbinder within the next few months.  She will follow-up with Dr. Reita Cliche management of hypokalemia.  She is scheduled to have labs with Dr. Barbie Banner within the next few weeks.  Betsy Coder, MD  05/04/2021  3:04 PM

## 2021-10-11 ENCOUNTER — Inpatient Hospital Stay: Payer: Medicare PPO | Attending: Oncology

## 2021-10-11 ENCOUNTER — Other Ambulatory Visit: Payer: Self-pay

## 2021-10-11 DIAGNOSIS — C187 Malignant neoplasm of sigmoid colon: Secondary | ICD-10-CM | POA: Diagnosis not present

## 2021-10-11 LAB — CEA (ACCESS): CEA (CHCC): <1 ng/mL (ref 0.00–5.00)

## 2021-10-24 ENCOUNTER — Encounter: Payer: Self-pay | Admitting: Oncology

## 2021-11-21 ENCOUNTER — Inpatient Hospital Stay: Payer: Medicare PPO | Admitting: Oncology

## 2021-11-21 ENCOUNTER — Other Ambulatory Visit: Payer: Medicare PPO

## 2021-12-01 ENCOUNTER — Ambulatory Visit: Payer: Medicare PPO | Admitting: Oncology

## 2021-12-04 ENCOUNTER — Other Ambulatory Visit: Payer: Self-pay

## 2021-12-04 ENCOUNTER — Inpatient Hospital Stay: Payer: Medicare PPO | Attending: Oncology | Admitting: Oncology

## 2021-12-04 VITALS — BP 132/61 | HR 70 | Temp 97.8°F | Resp 19 | Ht 63.0 in | Wt 145.8 lb

## 2021-12-04 DIAGNOSIS — E039 Hypothyroidism, unspecified: Secondary | ICD-10-CM | POA: Diagnosis not present

## 2021-12-04 DIAGNOSIS — C187 Malignant neoplasm of sigmoid colon: Secondary | ICD-10-CM | POA: Insufficient documentation

## 2021-12-04 DIAGNOSIS — Z800 Family history of malignant neoplasm of digestive organs: Secondary | ICD-10-CM | POA: Diagnosis not present

## 2021-12-04 NOTE — Progress Notes (Signed)
?[facility] ?OFFICE PROGRESS NOTE ? ? ?Diagnosis: Colon cancer ? ?INTERVAL HISTORY:  ? ?Crystal Lewis returns as scheduled.  She feels well.  Good appetite and energy level.  No difficulty with bowel function.  Her hair has regrown. ?She underwent a 05/22/2021.  The colon appeared normal.  She will be scheduled for a surveillance colonoscopy in 3 years. ? ?Objective: ? ?Vital signs in last 24 hours: ? ?Blood pressure 132/61, pulse 70, temperature 97.8 ?F (36.6 ?C), temperature source Oral, resp. rate 19, height _0  (1.6 m), weight 145 lb 12.8 oz (66.1 kg), SpO2 100 %. ?  ? ?Lymphatics: No cervical, supraclavicular, axillary, or inguinal nodes ?Resp: Lungs clear bilaterally ?Cardio: Regular rate and rhythm ?GI: No hepatosplenomegaly, no mass, nontender ?Vascular: No leg edema ?  ? ? ?Lab Results: ? ?Lab Results  ?Component Value Date  ? WBC 6.3 01/30/2021  ? HGB 13.4 01/30/2021  ? HCT 39.2 01/30/2021  ? MCV 108.0 (H) 01/30/2021  ? PLT 211 01/30/2021  ? NEUTROABS 3.4 01/30/2021  ? ? ?CMP  ?Lab Results  ?Component Value Date  ? NA 140 05/02/2021  ? K 3.4 (L) 05/02/2021  ? CL 102 05/02/2021  ? CO2 31 05/02/2021  ? GLUCOSE 83 05/02/2021  ? BUN 18 05/02/2021  ? CREATININE 0.97 05/02/2021  ? CALCIUM 8.8 (L) 05/02/2021  ? PROT 6.7 05/02/2021  ? ALBUMIN 3.8 05/02/2021  ? AST 17 05/02/2021  ? ALT 10 05/02/2021  ? ALKPHOS 64 05/02/2021  ? BILITOT 0.7 05/02/2021  ? GFRNONAA 60 (L) 05/02/2021  ? ? ?Lab Results  ?Component Value Date  ? CEA1 1.36 05/02/2021  ? CEA <1.00 10/11/2021  ? ? ?Medications: I have reviewed the patient's current medications. ? ? ?Assessment/Plan: ? ?Colon cancer, sigmoid, stage IIIb (T3N1), status post a robotic sigmoid colectomy 07/12/2020 ?        Colonoscopy 06/10/2020-sigmoid colon mass-invasive well differentiated adenocarcinoma, mismatch repair protein expression intact, incomplete colonoscopy? 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 ?Robotic sigmoid colectomy 07/12/2020 1/12 lymph nodes, lymphovascular invasion present ?Intact mismatch repair protein expression ?Tumor at the distal sigmoid colon proximal to the peritoneal reflection ?CT chest 08/03/2020-no evidence of metastatic disease ?Cycle 1 adjuvant Xeloda 08/15/2020 ?Cycle 2 adjuvant Xeloda 09/05/2020, discontinued after the morning dose on 09/15/2020 secondary to mouth soreness and hand/foot syndrome ?Cycle 3 adjuvant Xeloda 09/26/2020, Xeloda dose reduced to 1000 mg twice daily ?Cycle 4 adjuvant Xeloda 10/17/2020 (Xeloda 1000 mg twice daily) ?Cycle 5 adjuvant Xeloda 11/07/2020 (dose escalated to 1500 mg every morning and 1000 mg every afternoon) ?Cycle 6 adjuvant Xeloda 11/28/2020 (1500 mg every morning and 1000 mg every afternoon)-patient reduced dose to 1000 mg twice daily ?Cycle 7 adjuvant Xeloda 12/19/2020 (1500 mg every morning and 1000 mg every afternoon), 12/29/2018 2 PM dose held secondary to lip swelling/cracking, no Xeloda on 01/01/2021 secondary to erythema and pain at the soles ?Cycle 8 adjuvant Xeloda 01/09/2021 ?CTs 05/02/2021-no evidence of recurrent disease ?Colonoscopy 05/22/2021-negative ?Hypertension ?Family history of breast and colon cancer ?Depression ?Hypothyroidism ?Oral candidiasis following surgery October 2021 ?Urinary tract infection 09/08/2020 ?Hand/foot syndrome secondary Xeloda-Xeloda dose reduced beginning  with cycle 3 ?History of hypokalemia secondary to diuretic therapy ?  ?  ? ? ?Disposition: ?Crystal Lewis is in clinical remission from colon cancer.  The CEA was normal in January.  She will return for an office  visit, CEA, and surveillance CTs in August.  She will continue colonoscopy surveillance with Dr.Badreddine. ? ?Betsy Coder, MD ? ?12/04/2021  ?12:35 PM ? ? ? ?

## 2022-02-27 ENCOUNTER — Encounter (INDEPENDENT_AMBULATORY_CARE_PROVIDER_SITE_OTHER): Payer: Medicare PPO | Admitting: Ophthalmology

## 2022-03-01 ENCOUNTER — Other Ambulatory Visit: Payer: Self-pay | Admitting: *Deleted

## 2022-03-01 DIAGNOSIS — C187 Malignant neoplasm of sigmoid colon: Secondary | ICD-10-CM

## 2022-03-05 ENCOUNTER — Other Ambulatory Visit: Payer: Self-pay | Admitting: *Deleted

## 2022-03-05 ENCOUNTER — Ambulatory Visit (INDEPENDENT_AMBULATORY_CARE_PROVIDER_SITE_OTHER): Payer: Medicare PPO | Admitting: Ophthalmology

## 2022-03-05 ENCOUNTER — Encounter (INDEPENDENT_AMBULATORY_CARE_PROVIDER_SITE_OTHER): Payer: Self-pay | Admitting: Ophthalmology

## 2022-03-05 DIAGNOSIS — H353132 Nonexudative age-related macular degeneration, bilateral, intermediate dry stage: Secondary | ICD-10-CM | POA: Diagnosis not present

## 2022-03-05 DIAGNOSIS — H2513 Age-related nuclear cataract, bilateral: Secondary | ICD-10-CM | POA: Diagnosis not present

## 2022-03-05 NOTE — Progress Notes (Unsigned)
error 

## 2022-03-05 NOTE — Progress Notes (Signed)
03/05/2022     CHIEF COMPLAINT Patient presents for  Chief Complaint  Patient presents with   Macular Degeneration      HISTORY OF PRESENT ILLNESS: Crystal Lewis is a 81 y.o. female who presents to the clinic today for:   HPI   1 yr fu OU OCT. Patient states "In my right eye there is no change. Looking at the grid, within the last 2-3 weeks I have noticed a slight change in the left eye. A couple of the lines are not straight. I might have a couple grey spots in the middle, which is brand new like within this last week. Usually with floaters you can look around and it changes but this is different but it is hard to tell." Last edited by Laurin Coder on 03/05/2022 10:39 AM.      Referring physician: Warden Fillers, MD [address] [place],  [place] [id number]  HISTORICAL INFORMATION:   Selected notes from the MEDICAL RECORD NUMBER    Lab Results  Component Value Date   HGBA1C 5.7 (H) 07/07/2020     CURRENT MEDICATIONS: No current outpatient medications on file. (Ophthalmic Drugs)   No current facility-administered medications for this visit. (Ophthalmic Drugs)   Current Outpatient Medications (Other)  Medication Sig   atenolol-chlorthalidone (TENORETIC) 50-25 MG per tablet Take 1 tablet by mouth daily.   Biotin 5000 MCG TABS Take 5,000 mcg by mouth daily.   buPROPion (WELLBUTRIN SR) 150 MG 12 hr tablet Take 150 mg by mouth 2 (two) times daily. Must be the name brand   Calcium Carbonate-Vitamin D (CALCIUM-D PO) Take 1 tablet by mouth daily. Plus minerals   fluocinonide (LIDEX) 0.05 % external solution Apply 1 application topically See admin instructions. 4 times a year   levothyroxine (SYNTHROID) 75 MCG tablet Take 75 mcg by mouth daily before breakfast.   liothyronine (CYTOMEL) 5 MCG tablet Take 1 tablet (5 mcg total) by mouth daily.   metroNIDAZOLE (METROGEL) 1 % gel Apply topically daily.   Misc Natural Products (OSTEO BI-FLEX ADV TRIPLE ST PO) Take  1 tablet by mouth in the morning and at bedtime.   Multiple Vitamins-Minerals (PRESERVISION AREDS PO) Take 1 capsule by mouth in the morning and at bedtime.   NON FORMULARY Apply 1 application topically as needed (Hormones). Estriol/testosterone cream   Polyethylene Glycol 3350 (MIRALAX PO) Take 17 g by mouth as needed.   PREMARIN vaginal cream Place 1 Applicatorful vaginally every 30 (thirty) days.   raloxifene (EVISTA) 60 MG tablet Take 1 tablet (60 mg total) by mouth daily.   No current facility-administered medications for this visit. (Other)      REVIEW OF SYSTEMS: ROS   Negative for: Constitutional, Gastrointestinal, Neurological, Skin, Genitourinary, Musculoskeletal, HENT, Endocrine, Cardiovascular, Eyes, Respiratory, Psychiatric, Allergic/Imm, Heme/Lymph Last edited by Hurman Horn, MD on 03/05/2022 11:55 AM.       ALLERGIES Allergies  Allergen Reactions   Adhesive [Tape] Other (See Comments)    Leave on to long cause skin to peel off with tape   Sulfa Antibiotics Rash    PAST MEDICAL HISTORY Past Medical History:  Diagnosis Date   Allergy    Atrophic vaginitis    Depression    Hypertension    Hypothyroidism    Low bone mass    Osteoporosis    Thyroid disease    Hypothyroid   Past Surgical History:  Procedure Laterality Date   COSMETIC SURGERY     FOOT  SURGERY     TONSILLECTOMY     TUBAL LIGATION      FAMILY HISTORY Family History  Problem Relation Age of Onset   Breast cancer Mother        Age 9   Hypertension Mother    Cancer Mother    Mental illness Mother    Mental illness Maternal Grandmother    Cancer Maternal Grandfather    Heart disease Paternal Grandmother    Hypertension Son    Breast cancer Cousin        Female 1st Mat cousin age 49    SOCIAL HISTORY Social History   Tobacco Use   Smoking status: Never   Smokeless tobacco: Never  Substance Use Topics   Alcohol use: Yes    Comment: rare   Drug use: No         OPHTHALMIC  EXAM:  Base Eye Exam     Visual Acuity (ETDRS)       Right Left   Dist cc 20/20 -1 20/30 +2   Dist ph cc  20/25    Correction: Glasses         Tonometry (Tonopen, 10:44 AM)       Right Left   Pressure 14 17         Pupils       Pupils Dark Light APD   Right PERRL 3 2 None   Left PERRL 3 2 None         Visual Fields (Counting fingers)       Left Right    Full Full         Extraocular Movement       Right Left    Esotropia FTFC    -- -- --  --  --  -- -- --   -- -- --  --  --  -- -- --           Neuro/Psych     Oriented x3: Yes   Mood/Affect: Normal         Dilation     Both eyes: 1.0% Mydriacyl, 2.5% Phenylephrine @ 10:44 AM           Slit Lamp and Fundus Exam     External Exam       Right Left   External Normal Normal         Slit Lamp Exam       Right Left   Lids/Lashes Normal Normal   Conjunctiva/Sclera White and quiet White and quiet   Cornea Clear Clear   Anterior Chamber Deep and quiet Deep and quiet   Iris Round and reactive Round and reactive   Lens 2+ Nuclear sclerosis 2+ Nuclear sclerosis   Anterior Vitreous Normal Normal         Fundus Exam       Right Left   Posterior Vitreous Normal Normal   Disc Normal Normal   C/D Ratio 0.1    Macula Soft drusen, Intermediate age related macular degeneration Soft drusen, Intermediate age related macular degeneration   Vessels Normal Normal   Periphery Normal Normal            IMAGING AND PROCEDURES  Imaging and Procedures for 03/05/22  OCT, Retina - OU - Both Eyes       Right Eye Quality was good. Scan locations included subfoveal. Central Foveal Thickness: 296. Findings include retinal drusen , no IRF, no SRF.   Left Eye Quality was good. Scan locations  included subfoveal. Central Foveal Thickness: 283. Findings include retinal drusen , no IRF, no SRF.   Notes OS, vastly improved macular findings much smaller drusen subfoveal              ASSESSMENT/PLAN:  Intermediate stage nonexudative age-related macular degeneration of both eyes The nature of age--related macular degeneration was discussed with the patient as well as the distinction between dry and wet types. Checking an Amsler Grid daily with advice to return immediately should a distortion develop, was given to the patient. The patient 's smoking status now and in the past was determined and advice based on the AREDS study was provided regarding the consumption of antioxidant supplements. AREDS 2 vitamin formulation was recommended. Consumption of dark leafy vegetables and fresh fruits of various colors was recommended. Treatment modalities for wet macular degeneration particularly the use of intravitreal injections of anti-blood vessel growth factors was discussed with the patient. Avastin, Lucentis, and Eylea are the available options. On occasion, therapy includes the use of photodynamic therapy and thermal laser. Stressed to the patient do not rub eyes.  Patient was advised to check Amsler Grid daily and return immediately if changes are noted. Instructions on using the grid were given to the patient. All patient questions were answered.  OD, subfoveal drusen, largely unchanged year-over-year by OCT and examination  OS, much smaller large pigment epithelial detachment drusenoid findings in the foveal location by exam as well as by OCT.  Coincidentally no sign of CNVM.  Nonetheless this could explain her new onset slight distortion as neuro adaptation is yet to take place  Nuclear sclerotic cataract of both eyes Bilateral follow-up at Dr. Katy Fitch of [facility] eye care, Dr. Warden Fillers.     ICD-10-CM   1. Intermediate stage nonexudative age-related macular degeneration of both eyes  H35.3132 OCT, Retina - OU - Both Eyes    2. Nuclear sclerotic cataract of both eyes  H25.13       1.  OD and OS, stable overall intermediate AMD with actually left eye showing signs of much  smaller large drusenoid PED subfoveal.    2.  Wavy line 1 above 1 below the center fixation thus not likely CNVM but we will monitor follow-up left eye dilate in 8 weeks  3.  Ophthalmic Meds Ordered this visit:  No orders of the defined types were placed in this encounter.      Return in about 8 weeks (around 04/30/2022) for dilate, OS, OCT.  There are no Patient Instructions on file for this visit.   Explained the diagnoses, plan, and follow up with the patient and they expressed understanding.  Patient expressed understanding of the importance of proper follow up care.   Clent Demark Annella Prowell M.D. Diseases & Surgery of the Retina and Vitreous [facility] 03/05/22     Abbreviations: M myopia (nearsighted); A astigmatism; H hyperopia (farsighted); P presbyopia; Mrx spectacle prescription;  CTL contact lenses; OD right eye; OS left eye; OU both eyes  XT exotropia; ET esotropia; PEK punctate epithelial keratitis; PEE punctate epithelial erosions; DES dry eye syndrome; MGD meibomian gland dysfunction; ATs artificial tears; PFAT's preservative free artificial tears; [place] nuclear sclerotic cataract; PSC posterior subcapsular cataract; ERM epi-retinal membrane; PVD posterior vitreous detachment; RD retinal detachment; DM diabetes mellitus; DR diabetic retinopathy; NPDR non-proliferative diabetic retinopathy; PDR proliferative diabetic retinopathy; CSME clinically significant macular edema; DME diabetic macular edema; dbh dot blot hemorrhages; CWS cotton wool spot; POAG primary open angle glaucoma; C/D cup-to-disc  ratio; HVF humphrey visual field; GVF goldmann visual field; OCT optical coherence tomography; IOP intraocular pressure; BRVO Branch retinal vein occlusion; CRVO central retinal vein occlusion; CRAO central retinal artery occlusion; BRAO branch retinal artery occlusion; RT retinal tear; SB scleral buckle; PPV pars plana vitrectomy; VH Vitreous hemorrhage; PRP panretinal laser  photocoagulation; IVK intravitreal kenalog; VMT vitreomacular traction; MH Macular hole;  NVD neovascularization of the disc; NVE neovascularization elsewhere; AREDS age related eye disease study; ARMD age related macular degeneration; POAG primary open angle glaucoma; EBMD epithelial/anterior basement membrane dystrophy; ACIOL anterior chamber intraocular lens; IOL intraocular lens; PCIOL posterior chamber intraocular lens; Phaco/IOL phacoemulsification with intraocular lens placement; [place] photorefractive keratectomy; LASIK laser assisted in situ keratomileusis; HTN hypertension; DM diabetes mellitus; COPD chronic obstructive pulmonary disease

## 2022-03-05 NOTE — Assessment & Plan Note (Signed)
Bilateral follow-up at Dr. Katy Fitch of [facility] eye care, Dr. Warden Fillers.

## 2022-03-05 NOTE — Assessment & Plan Note (Addendum)
The nature of age--related macular degeneration was discussed with the patient as well as the distinction between dry and wet types. Checking an Amsler Grid daily with advice to return immediately should a distortion develop, was given to the patient. The patient 's smoking status now and in the past was determined and advice based on the AREDS study was provided regarding the consumption of antioxidant supplements. AREDS 2 vitamin formulation was recommended. Consumption of dark leafy vegetables and fresh fruits of various colors was recommended. Treatment modalities for wet macular degeneration particularly the use of intravitreal injections of anti-blood vessel growth factors was discussed with the patient. Avastin, Lucentis, and Eylea are the available options. On occasion, therapy includes the use of photodynamic therapy and thermal laser. Stressed to the patient do not rub eyes.  Patient was advised to check Amsler Grid daily and return immediately if changes are noted. Instructions on using the grid were given to the patient. All patient questions were answered.  OD, subfoveal drusen, largely unchanged year-over-year by OCT and examination  OS, much smaller large pigment epithelial detachment drusenoid findings in the foveal location by exam as well as by OCT.  Coincidentally no sign of CNVM.  Nonetheless this could explain her new onset slight distortion as neuro adaptation is yet to take place

## 2022-03-16 ENCOUNTER — Other Ambulatory Visit: Payer: Self-pay

## 2022-03-16 DIAGNOSIS — C187 Malignant neoplasm of sigmoid colon: Secondary | ICD-10-CM

## 2022-04-06 ENCOUNTER — Telehealth: Payer: Self-pay | Admitting: *Deleted

## 2022-04-06 NOTE — Telephone Encounter (Signed)
Left VM for Crystal Lewis with CT appointment at [facility] on 05/08/22 at 1345/1400. NPO 4 hours prior and drink contrast at 12:00 and 1 pm. Pick up contrast any day before the day of scan. Sent this via [place] as well. Notified managed care for PA

## 2022-04-09 ENCOUNTER — Telehealth: Payer: Self-pay | Admitting: *Deleted

## 2022-04-09 DIAGNOSIS — C187 Malignant neoplasm of sigmoid colon: Secondary | ICD-10-CM

## 2022-04-09 NOTE — Telephone Encounter (Signed)
Left VM for patient that Dr. Benay Spice said the squamous epithelial cells were most likely a contaminant. OK to check UA again on 8/8 if she wishes.

## 2022-04-09 NOTE — Telephone Encounter (Signed)
Crystal Lewis left VM confirming her CT scan appointment and prep. Reports that PCP did U/A and it showed squamous epithelial cells in urine and will re-check in December.  She is asking if Dr. Benay Spice will order a UA on her when she comes for CT labs on 05/08/22?

## 2022-04-16 ENCOUNTER — Other Ambulatory Visit: Payer: Self-pay

## 2022-04-16 DIAGNOSIS — C187 Malignant neoplasm of sigmoid colon: Secondary | ICD-10-CM

## 2022-04-16 NOTE — Addendum Note (Signed)
Addended by: Lenox Ponds E on: 04/16/2022 04:46 PM   Modules accepted: Orders

## 2022-04-30 ENCOUNTER — Encounter (INDEPENDENT_AMBULATORY_CARE_PROVIDER_SITE_OTHER): Payer: Self-pay | Admitting: Ophthalmology

## 2022-04-30 ENCOUNTER — Ambulatory Visit (INDEPENDENT_AMBULATORY_CARE_PROVIDER_SITE_OTHER): Payer: Medicare PPO | Admitting: Ophthalmology

## 2022-04-30 DIAGNOSIS — H353221 Exudative age-related macular degeneration, left eye, with active choroidal neovascularization: Secondary | ICD-10-CM | POA: Insufficient documentation

## 2022-04-30 DIAGNOSIS — H353132 Nonexudative age-related macular degeneration, bilateral, intermediate dry stage: Secondary | ICD-10-CM

## 2022-04-30 DIAGNOSIS — H2513 Age-related nuclear cataract, bilateral: Secondary | ICD-10-CM

## 2022-04-30 MED ORDER — BEVACIZUMAB CHEMO INJECTION 1.25MG/0.05ML SYRINGE FOR KALEIDOSCOPE
1.2500 mg | INTRAVITREAL | Status: AC | PRN
  Administered 2022-04-30: 1.25 mg via INTRAVITREAL

## 2022-04-30 NOTE — Assessment & Plan Note (Addendum)
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  Forms of therapy reviewed include the use of Anti-VEGF medications injected painlessly into the eye, as well as other possible treatment modalities, including thermal laser therapy. Fellow eye involvement and risks were discussed with the patient. Upon the finding of wet age related macular degeneration, treatment will be offered. The treatment regimen is on a treat as needed basis with the intent to treat if necessary and extend interval of exams when possible. On average 1 out of 6 patients do not need lifetime therapy. However, the risk of recurrent disease is high for a lifetime.  Initially monthly, then periodic, examinations and evaluations will determine whether the next treatment is required on the day of the examination.  New serous retinal detachment signifying CNVM, wet AMD.  We will commence with therapy to halt progression and preserve acuity OS.

## 2022-04-30 NOTE — Assessment & Plan Note (Signed)
Stable OU 

## 2022-04-30 NOTE — Assessment & Plan Note (Signed)
OD no sign of CNVM by OCT today

## 2022-04-30 NOTE — Progress Notes (Signed)
04/30/2022     CHIEF COMPLAINT Patient presents for  Chief Complaint  Patient presents with   Macular Degeneration      HISTORY OF PRESENT ILLNESS: Crystal Lewis is a 81 y.o. female who presents to the clinic today for:   HPI   Pt states her vision has been stable  Pt denies any new floaters or FOL  Last edited by Morene Rankins, CMA on 04/30/2022 10:42 AM.      Referring physician: Merry Lofty, [place],  [place] [address]  HISTORICAL INFORMATION:   Selected notes from the MEDICAL RECORD NUMBER    Lab Results  Component Value Date   HGBA1C 5.7 (H) 07/07/2020     CURRENT MEDICATIONS: No current outpatient medications on file. (Ophthalmic Drugs)   No current facility-administered medications for this visit. (Ophthalmic Drugs)   Current Outpatient Medications (Other)  Medication Sig   atenolol-chlorthalidone (TENORETIC) 50-25 MG per tablet Take 1 tablet by mouth daily.   Biotin 5000 MCG TABS Take 5,000 mcg by mouth daily.   buPROPion (WELLBUTRIN SR) 150 MG 12 hr tablet Take 150 mg by mouth 2 (two) times daily. Must be the name brand   Calcium Carbonate-Vitamin D (CALCIUM-D PO) Take 1 tablet by mouth daily. Plus minerals   fluocinonide (LIDEX) 0.05 % external solution Apply 1 application topically See admin instructions. 4 times a year   levothyroxine (SYNTHROID) 75 MCG tablet Take 75 mcg by mouth daily before breakfast.   liothyronine (CYTOMEL) 5 MCG tablet Take 1 tablet (5 mcg total) by mouth daily.   metroNIDAZOLE (METROGEL) 1 % gel Apply topically daily.   Misc Natural Products (OSTEO BI-FLEX ADV TRIPLE ST PO) Take 1 tablet by mouth in the morning and at bedtime.   Multiple Vitamins-Minerals (PRESERVISION AREDS PO) Take 1 capsule by mouth in the morning and at bedtime.   NON FORMULARY Apply 1 application topically as needed (Hormones). Estriol/testosterone cream   Polyethylene Glycol 3350 (MIRALAX PO) Take 17 g by mouth as needed.    PREMARIN vaginal cream Place 1 Applicatorful vaginally every 30 (thirty) days.   raloxifene (EVISTA) 60 MG tablet Take 1 tablet (60 mg total) by mouth daily.   No current facility-administered medications for this visit. (Other)      REVIEW OF SYSTEMS: ROS   Negative for: Constitutional, Gastrointestinal, Neurological, Skin, Genitourinary, Musculoskeletal, HENT, Endocrine, Cardiovascular, Eyes, Respiratory, Psychiatric, Allergic/Imm, Heme/Lymph Last edited by Hurman Horn, MD on 04/30/2022 11:21 AM.       ALLERGIES Allergies  Allergen Reactions   Adhesive [Tape] Other (See Comments)    Leave on to long cause skin to peel off with tape   Sulfa Antibiotics Rash    PAST MEDICAL HISTORY Past Medical History:  Diagnosis Date   Allergy    Atrophic vaginitis    Depression    Hypertension    Hypothyroidism    Low bone mass    Osteoporosis    Thyroid disease    Hypothyroid   Past Surgical History:  Procedure Laterality Date   COSMETIC SURGERY     FOOT SURGERY     TONSILLECTOMY     TUBAL LIGATION      FAMILY HISTORY Family History  Problem Relation Age of Onset   Breast cancer Mother        Age 40   Hypertension Mother    Cancer Mother    Mental illness Mother    Mental illness Maternal Grandmother  Cancer Maternal Grandfather    Heart disease Paternal Grandmother    Hypertension Son    Breast cancer Cousin        Female 1st Mat cousin age 73    SOCIAL HISTORY Social History   Tobacco Use   Smoking status: Never   Smokeless tobacco: Never  Substance Use Topics   Alcohol use: Yes    Comment: rare   Drug use: No         OPHTHALMIC EXAM:  Base Eye Exam     Visual Acuity (ETDRS)       Right Left   Dist [place] 20/20 20/25         Tonometry (Tonopen, 10:50 AM)       Right Left   Pressure 11 10         Pupils       Pupils APD   Right PERRL None   Left PERRL          Visual Fields       Left Right    Full Full          Extraocular Movement       Right Left    Full, Ortho Full, Ortho         Neuro/Psych     Oriented x3: Yes   Mood/Affect: Normal         Dilation     Left eye: 2.5% Phenylephrine, 1.0% Mydriacyl @ 10:43 AM           Slit Lamp and Fundus Exam     External Exam       Right Left   External Normal Normal         Slit Lamp Exam       Right Left   Lids/Lashes Normal Normal   Conjunctiva/Sclera White and quiet White and quiet   Cornea Clear Clear   Anterior Chamber Deep and quiet Deep and quiet   Iris Round and reactive Round and reactive   Lens 2+ Nuclear sclerosis 2+ Nuclear sclerosis   Anterior Vitreous Normal Normal         Fundus Exam       Right Left   Posterior Vitreous  Normal   Disc  Normal   Macula  Soft drusen, Intermediate age related macular degeneration   Vessels  Normal   Periphery  Normal            IMAGING AND PROCEDURES  Imaging and Procedures for 04/30/22  OCT, Retina - OU - Both Eyes       Right Eye Quality was good. Scan locations included subfoveal. Central Foveal Thickness: 306. Progression has been stable. Findings include no IRF, no SRF, retinal drusen .   Left Eye Quality was good. Scan locations included subfoveal. Central Foveal Thickness: 345. Progression has been stable. Findings include no IRF, no SRF, retinal drusen .   Notes OS, vastly improved macular findings much smaller drusen subfoveal      Intravitreal Injection, Pharmacologic Agent - OS - Left Eye       Time Out 04/30/2022. 11:26 AM. Confirmed correct patient, procedure, site, and patient consented.   Anesthesia Topical anesthesia was used. Anesthetic medications included Lidocaine 4%.   Procedure Preparation included 5% betadine to ocular surface, 10% betadine to eyelids, Tobramycin 0.3%. A 30 gauge needle was used.   Injection: 1.25 mg Bevacizumab 1.'25mg'$ /0.57m   Route: Intravitreal, Site: Left Eye   NDC: [phone]  ASSESSMENT/PLAN:  Intermediate stage nonexudative age-related macular degeneration of both eyes OD no sign of CNVM by OCT today  Exudative age-related macular degeneration of left eye with active choroidal neovascularization (HCC) The nature of wet macular degeneration was discussed with the patient.  Forms of therapy reviewed include the use of Anti-VEGF medications injected painlessly into the eye, as well as other possible treatment modalities, including thermal laser therapy. Fellow eye involvement and risks were discussed with the patient. Upon the finding of wet age related macular degeneration, treatment will be offered. The treatment regimen is on a treat as needed basis with the intent to treat if necessary and extend interval of exams when possible. On average 1 out of 6 patients do not need lifetime therapy. However, the risk of recurrent disease is high for a lifetime.  Initially monthly, then periodic, examinations and evaluations will determine whether the next treatment is required on the day of the examination.  New serous retinal detachment signifying CNVM, wet AMD.  We will commence with therapy to halt progression and preserve acuity OS.  Nuclear sclerotic cataract of both eyes Stable OU.     ICD-10-CM   1. Exudative age-related macular degeneration of left eye with active choroidal neovascularization (HCC)  H35.3221 Intravitreal Injection, Pharmacologic Agent - OS - Left Eye    Bevacizumab (AVASTIN) SOLN 1.25 mg    2. Intermediate stage nonexudative age-related macular degeneration of both eyes  H35.3132 OCT, Retina - OU - Both Eyes    3. Nuclear sclerotic cataract of both eyes  H25.13       1.  No change in symptomatology, however patient on clinical examination on OCT has new serous subfoveal detachment left eye signifying wet AMD.  Need to commence with therapy today intravitreal Avastin offered and delivered.  Follow-up next in 5 to 6 weeks  2.  3.  Ophthalmic  Meds Ordered this visit:  Meds ordered this encounter  Medications   Bevacizumab (AVASTIN) SOLN 1.25 mg       Return in about 5 weeks (around 06/04/2022) for dilate, OS, AVASTIN OCT.  There are no Patient Instructions on file for this visit.   Explained the diagnoses, plan, and follow up with the patient and they expressed understanding.  Patient expressed understanding of the importance of proper follow up care.   Clent Demark Jane Birkel M.D. Diseases & Surgery of the Retina and Vitreous [facility] [place] 04/30/22     Abbreviations: M myopia (nearsighted); A astigmatism; H hyperopia (farsighted); P presbyopia; Mrx spectacle prescription;  CTL contact lenses; OD right eye; OS left eye; OU both eyes  XT exotropia; ET esotropia; PEK punctate epithelial keratitis; PEE punctate epithelial erosions; DES dry eye syndrome; MGD meibomian gland dysfunction; ATs artificial tears; PFAT's preservative free artificial tears; [place] nuclear sclerotic cataract; PSC posterior subcapsular cataract; ERM epi-retinal membrane; PVD posterior vitreous detachment; RD retinal detachment; DM diabetes mellitus; DR diabetic retinopathy; NPDR non-proliferative diabetic retinopathy; PDR proliferative diabetic retinopathy; CSME clinically significant macular edema; DME diabetic macular edema; dbh dot blot hemorrhages; CWS cotton wool spot; POAG primary open angle glaucoma; C/D cup-to-disc ratio; HVF humphrey visual field; GVF goldmann visual field; OCT optical coherence tomography; IOP intraocular pressure; BRVO Branch retinal vein occlusion; CRVO central retinal vein occlusion; CRAO central retinal artery occlusion; BRAO branch retinal artery occlusion; RT retinal tear; SB scleral buckle; PPV pars plana vitrectomy; VH Vitreous hemorrhage; PRP panretinal laser photocoagulation; IVK intravitreal kenalog; VMT vitreomacular traction; MH Macular hole;  NVD neovascularization of the  disc; NVE neovascularization elsewhere; AREDS  age related eye disease study; ARMD age related macular degeneration; POAG primary open angle glaucoma; EBMD epithelial/anterior basement membrane dystrophy; ACIOL anterior chamber intraocular lens; IOL intraocular lens; PCIOL posterior chamber intraocular lens; Phaco/IOL phacoemulsification with intraocular lens placement; [place] photorefractive keratectomy; LASIK laser assisted in situ keratomileusis; HTN hypertension; DM diabetes mellitus; COPD chronic obstructive pulmonary disease

## 2022-05-08 ENCOUNTER — Ambulatory Visit (HOSPITAL_BASED_OUTPATIENT_CLINIC_OR_DEPARTMENT_OTHER)
Admission: RE | Admit: 2022-05-08 | Discharge: 2022-05-08 | Disposition: A | Discharge status: Home / Self Care | Payer: Medicare PPO | Source: Ambulatory Visit | Attending: Oncology | Admitting: Oncology

## 2022-05-08 ENCOUNTER — Inpatient Hospital Stay: Payer: Medicare PPO | Attending: Oncology

## 2022-05-08 ENCOUNTER — Encounter (HOSPITAL_BASED_OUTPATIENT_CLINIC_OR_DEPARTMENT_OTHER): Payer: Self-pay

## 2022-05-08 DIAGNOSIS — C187 Malignant neoplasm of sigmoid colon: Secondary | ICD-10-CM | POA: Insufficient documentation

## 2022-05-08 DIAGNOSIS — Z9221 Personal history of antineoplastic chemotherapy: Secondary | ICD-10-CM | POA: Diagnosis not present

## 2022-05-08 DIAGNOSIS — Z85038 Personal history of other malignant neoplasm of large intestine: Secondary | ICD-10-CM | POA: Diagnosis present

## 2022-05-08 LAB — CBC WITH DIFFERENTIAL (CANCER CENTER ONLY)
Abs Immature Granulocytes: 0.01 10*3/uL (ref 0.00–0.07)
Basophils Absolute: 0.1 10*3/uL (ref 0.0–0.1)
Basophils Relative: 1 %
Eosinophils Absolute: 0.1 10*3/uL (ref 0.0–0.5)
Eosinophils Relative: 2 %
HCT: 42.5 % (ref 36.0–46.0)
Hemoglobin: 14.6 g/dL (ref 12.0–15.0)
Immature Granulocytes: 0 %
Lymphocytes Relative: 22 %
Lymphs Abs: 1.7 10*3/uL (ref 0.7–4.0)
MCH: 31.5 pg (ref 26.0–34.0)
MCHC: 34.4 g/dL (ref 30.0–36.0)
MCV: 91.8 fL (ref 80.0–100.0)
Monocytes Absolute: 0.7 10*3/uL (ref 0.1–1.0)
Monocytes Relative: 9 %
Neutro Abs: 5.3 10*3/uL (ref 1.7–7.7)
Neutrophils Relative %: 66 %
Platelet Count: 259 10*3/uL (ref 150–400)
RBC: 4.63 MIL/uL (ref 3.87–5.11)
RDW: 12.9 % (ref 11.5–15.5)
WBC Count: 7.9 10*3/uL (ref 4.0–10.5)
nRBC: 0 % (ref 0.0–0.2)

## 2022-05-08 LAB — URINALYSIS, COMPLETE (UACMP) WITH MICROSCOPIC
Bilirubin Urine: NEGATIVE
Glucose, UA: NEGATIVE mg/dL
Hgb urine dipstick: NEGATIVE
Ketones, ur: NEGATIVE mg/dL
Nitrite: NEGATIVE
Protein, ur: NEGATIVE mg/dL
Specific Gravity, Urine: 1.011 (ref 1.005–1.030)
pH: 7 (ref 5.0–8.0)

## 2022-05-08 LAB — CMP (CANCER CENTER ONLY)
ALT: 11 U/L (ref 0–44)
AST: 18 U/L (ref 15–41)
Albumin: 3.9 g/dL (ref 3.5–5.0)
Alkaline Phosphatase: 64 U/L (ref 38–126)
Anion gap: 10 (ref 5–15)
BUN: 15 mg/dL (ref 8–23)
CO2: 31 mmol/L (ref 22–32)
Calcium: 9.6 mg/dL (ref 8.9–10.3)
Chloride: 96 mmol/L — ABNORMAL LOW (ref 98–111)
Creatinine: 1.03 mg/dL — ABNORMAL HIGH (ref 0.44–1.00)
GFR, Estimated: 55 mL/min — ABNORMAL LOW (ref >60)
Glucose, Bld: 84 mg/dL (ref 70–99)
Potassium: 3.6 mmol/L (ref 3.5–5.1)
Sodium: 137 mmol/L (ref 135–145)
Total Bilirubin: 0.9 mg/dL (ref 0.3–1.2)
Total Protein: 7 g/dL (ref 6.5–8.1)

## 2022-05-08 LAB — CEA (ACCESS): CEA (CHCC): 1.12 ng/mL (ref 0.00–5.00)

## 2022-05-08 MED ORDER — IOHEXOL 300 MG/ML  SOLN
100.0000 mL | Freq: Once | INTRAMUSCULAR | Status: AC | PRN
  Administered 2022-05-08: 85 mL via INTRAVENOUS

## 2022-05-09 ENCOUNTER — Telehealth: Payer: Self-pay

## 2022-05-09 NOTE — Telephone Encounter (Signed)
Called pt and message relayed per MD Benay Spice. Pt verbalizes understanding and appreciative of call back. Pt verbalizes understanding of upcoming appt.

## 2022-05-09 NOTE — Telephone Encounter (Signed)
-----   Message from Ladell Pier, MD sent at 05/08/2022  8:53 PM EDT ----- Please call patient, CTs are negative for cancer, f/u as scheduled

## 2022-05-15 ENCOUNTER — Inpatient Hospital Stay: Payer: Medicare PPO | Admitting: Oncology

## 2022-05-15 VITALS — BP 129/67 | HR 79 | Temp 98.2°F | Resp 18 | Ht 63.0 in | Wt 149.2 lb

## 2022-05-15 DIAGNOSIS — Z85038 Personal history of other malignant neoplasm of large intestine: Secondary | ICD-10-CM | POA: Diagnosis not present

## 2022-05-15 DIAGNOSIS — C187 Malignant neoplasm of sigmoid colon: Secondary | ICD-10-CM | POA: Diagnosis not present

## 2022-05-15 NOTE — Progress Notes (Signed)
[facility] OFFICE PROGRESS NOTE   Diagnosis: Colon cancer  INTERVAL HISTORY:   Crystal Lewis returns as scheduled.  She feels well.  Her bowel function has returned to normal.  Hair has regrown since starting Rogaine.  No new complaint.  Objective:  Vital signs in last 24 hours:  Blood pressure 129/67, pulse 79, temperature 98.2 F (36.8 C), temperature source Oral, resp. rate 18, height _0  (1.6 m), weight 149 lb 3.2 oz (67.7 kg), SpO2 100 %.    HEENT: Neck without mass Lymphatics: No cervical, supraclavicular, axillary, or inguinal nodes Resp: Lungs clear bilaterally Cardio: Regular rate and rhythm GI: No mass, no hepatosplenomegaly, nontender Vascular: No leg edema  Lab Results:  Lab Results  Component Value Date   WBC 7.9 05/08/2022   HGB 14.6 05/08/2022   HCT 42.5 05/08/2022   MCV 91.8 05/08/2022   PLT 259 05/08/2022   NEUTROABS 5.3 05/08/2022    CMP  Lab Results  Component Value Date   NA 137 05/08/2022   K 3.6 05/08/2022   CL 96 (L) 05/08/2022   CO2 31 05/08/2022   GLUCOSE 84 05/08/2022   BUN 15 05/08/2022   CREATININE 1.03 (H) 05/08/2022   CALCIUM 9.6 05/08/2022   PROT 7.0 05/08/2022   ALBUMIN 3.9 05/08/2022   AST 18 05/08/2022   ALT 11 05/08/2022   ALKPHOS 64 05/08/2022   BILITOT 0.9 05/08/2022   GFRNONAA 55 (L) 05/08/2022    Lab Results  Component Value Date   CEA1 1.36 05/02/2021   CEA 1.12 05/08/2022     Medications: I have reviewed the patient's current medications.   Assessment/Plan: Colon cancer, sigmoid, stage IIIb (T3N1), status post a robotic sigmoid colectomy 07/12/2020         Colonoscopy 06/10/2020-sigmoid colon mass-invasive well differentiated adenocarcinoma, mismatch repair protein expression intact, incomplete colonoscopy? 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 Robotic sigmoid colectomy 07/12/2020 1/12 lymph nodes, lymphovascular invasion present Intact mismatch repair protein expression Tumor at the distal sigmoid colon proximal to the peritoneal reflection CT chest 08/03/2020-no evidence of metastatic disease Cycle 1 adjuvant Xeloda 08/15/2020 Cycle 2 adjuvant Xeloda 09/05/2020, discontinued after the morning dose on 09/15/2020 secondary to mouth soreness and hand/foot syndrome Cycle 3 adjuvant Xeloda 09/26/2020, Xeloda dose reduced to 1000 mg twice daily Cycle 4 adjuvant Xeloda 10/17/2020 (Xeloda 1000 mg twice daily) Cycle 5 adjuvant Xeloda 11/07/2020 (dose escalated to 1500 mg every morning and 1000 mg every afternoon) Cycle 6 adjuvant Xeloda 11/28/2020 (1500 mg every morning and 1000 mg every afternoon)-patient reduced dose to 1000 mg twice daily Cycle 7 adjuvant Xeloda 12/19/2020 (1500 mg every morning and 1000 mg every afternoon), 12/29/2018 2 PM dose held secondary to lip swelling/cracking, no Xeloda on 01/01/2021 secondary to erythema and pain at the soles Cycle 8 adjuvant Xeloda 01/09/2021 CTs 05/02/2021-no evidence of recurrent disease Colonoscopy 05/22/2021-negative CTs 05/08/2022-no evidence of recurrent disease Hypertension Family history of breast and colon cancer Depression Hypothyroidism Oral candidiasis following surgery October 2021 Urinary tract infection 09/08/2020 Hand/foot syndrome secondary Xeloda-Xeloda dose reduced beginning with cycle 3 History of hypokalemia secondary to diuretic therapy  Disposition: [name] is in clinical remission from colon cancer.  She will return for an office visit and CEA in 6 months.  Betsy Coder, MD  05/15/2022  12:53 PM

## 2022-06-05 ENCOUNTER — Encounter (INDEPENDENT_AMBULATORY_CARE_PROVIDER_SITE_OTHER): Payer: Medicare PPO | Admitting: Ophthalmology

## 2022-06-06 ENCOUNTER — Encounter (INDEPENDENT_AMBULATORY_CARE_PROVIDER_SITE_OTHER): Payer: Medicare PPO | Admitting: Ophthalmology

## 2022-06-07 ENCOUNTER — Ambulatory Visit (INDEPENDENT_AMBULATORY_CARE_PROVIDER_SITE_OTHER): Payer: Medicare PPO | Admitting: Ophthalmology

## 2022-06-07 ENCOUNTER — Encounter (INDEPENDENT_AMBULATORY_CARE_PROVIDER_SITE_OTHER): Payer: Self-pay | Admitting: Ophthalmology

## 2022-06-07 DIAGNOSIS — H2513 Age-related nuclear cataract, bilateral: Secondary | ICD-10-CM

## 2022-06-07 DIAGNOSIS — H353132 Nonexudative age-related macular degeneration, bilateral, intermediate dry stage: Secondary | ICD-10-CM

## 2022-06-07 DIAGNOSIS — H353221 Exudative age-related macular degeneration, left eye, with active choroidal neovascularization: Secondary | ICD-10-CM

## 2022-06-07 MED ORDER — BEVACIZUMAB CHEMO INJECTION 1.25MG/0.05ML SYRINGE FOR KALEIDOSCOPE
1.2500 mg | INTRAVITREAL | Status: AC | PRN
  Administered 2022-06-07: 1.25 mg via INTRAVITREAL

## 2022-06-07 NOTE — Progress Notes (Signed)
06/07/2022     CHIEF COMPLAINT Patient presents for No chief complaint on file.     HISTORY OF PRESENT ILLNESS: Crystal Lewis is a 81 y.o. female who presents to the clinic today for:   HPI   5 weeks dialte os avastin oct Pt states her vision has been stable Pt denies any new floaters or FOL  Last edited by Morene Rankins, CMA on 06/07/2022  3:34 PM.      Referring physician: Merry Lofty, [place],  [place] [address]  HISTORICAL INFORMATION:   Selected notes from the MEDICAL RECORD NUMBER    Lab Results  Component Value Date   HGBA1C 5.7 (H) 07/07/2020     CURRENT MEDICATIONS: No current outpatient medications on file. (Ophthalmic Drugs)   No current facility-administered medications for this visit. (Ophthalmic Drugs)   Current Outpatient Medications (Other)  Medication Sig   atenolol-chlorthalidone (TENORETIC) 50-25 MG per tablet Take 1 tablet by mouth daily.   Biotin 5000 MCG TABS Take 5,000 mcg by mouth daily.   buPROPion (WELLBUTRIN SR) 150 MG 12 hr tablet Take 150 mg by mouth 2 (two) times daily. Must be the name brand   Calcium Carbonate-Vitamin D (CALCIUM-D PO) Take 1 tablet by mouth daily. Plus minerals   fluocinonide (LIDEX) 0.05 % external solution Apply 1 application topically See admin instructions. 4 times a year   levothyroxine (SYNTHROID) 75 MCG tablet Take 75 mcg by mouth daily before breakfast.   liothyronine (CYTOMEL) 5 MCG tablet Take 1 tablet (5 mcg total) by mouth daily.   metroNIDAZOLE (METROGEL) 1 % gel Apply topically daily.   Misc Natural Products (OSTEO BI-FLEX ADV TRIPLE ST PO) Take 1 tablet by mouth in the morning and at bedtime.   Multiple Vitamins-Minerals (PRESERVISION AREDS PO) Take 1 capsule by mouth in the morning and at bedtime.   NON FORMULARY Apply 1 application topically as needed (Hormones). Estriol/testosterone cream   PREMARIN vaginal cream Place 1 Applicatorful vaginally every 30 (thirty) days.    raloxifene (EVISTA) 60 MG tablet Take 1 tablet (60 mg total) by mouth daily.   No current facility-administered medications for this visit. (Other)      REVIEW OF SYSTEMS: ROS   Negative for: Constitutional, Gastrointestinal, Neurological, Skin, Genitourinary, Musculoskeletal, HENT, Endocrine, Cardiovascular, Eyes, Respiratory, Psychiatric, Allergic/Imm, Heme/Lymph Last edited by Morene Rankins, CMA on 06/07/2022  3:34 PM.       ALLERGIES Allergies  Allergen Reactions   Adhesive [Tape] Other (See Comments)    Leave on to long cause skin to peel off with tape   Sulfa Antibiotics Rash    PAST MEDICAL HISTORY Past Medical History:  Diagnosis Date   Allergy    Atrophic vaginitis    Depression    Hypertension    Hypothyroidism    Low bone mass    Osteoporosis    Thyroid disease    Hypothyroid   Past Surgical History:  Procedure Laterality Date   COSMETIC SURGERY     FOOT SURGERY     TONSILLECTOMY     TUBAL LIGATION      FAMILY HISTORY Family History  Problem Relation Age of Onset   Breast cancer Mother        Age 17   Hypertension Mother    Cancer Mother    Mental illness Mother    Mental illness Maternal Grandmother    Cancer Maternal Grandfather    Heart disease Paternal Grandmother    Hypertension  Son    Breast cancer Cousin        Female 1st Mat cousin age 57    SOCIAL HISTORY Social History   Tobacco Use   Smoking status: Never   Smokeless tobacco: Never  Substance Use Topics   Alcohol use: Yes    Comment: rare   Drug use: No         OPHTHALMIC EXAM:  Base Eye Exam     Visual Acuity (ETDRS)       Right Left   Dist cc 20/20 -1 20/25 -1    Correction: Glasses         Tonometry (Tonopen, 3:39 PM)       Right Left   Pressure 5 5         Pupils       Pupils   Right PERRL   Left PERRL         Visual Fields       Left Right    Full Full         Extraocular Movement       Right Left    Ortho Ortho    -- -- --   --  --  -- -- --   -- -- --  --  --  -- -- --           Neuro/Psych     Oriented x3: Yes   Mood/Affect: Normal         Dilation     Left eye: 2.5% Phenylephrine, 1.0% Mydriacyl @ 3:35 PM           Slit Lamp and Fundus Exam     External Exam       Right Left   External Normal Normal         Slit Lamp Exam       Right Left   Lids/Lashes Normal Normal   Conjunctiva/Sclera White and quiet White and quiet   Cornea Clear Clear   Anterior Chamber Deep and quiet Deep and quiet   Iris Round and reactive Round and reactive   Lens 2+ Nuclear sclerosis 2+ Nuclear sclerosis   Anterior Vitreous Normal Normal         Fundus Exam       Right Left   Posterior Vitreous  Normal   Disc  Normal   Macula  Soft drusen, Intermediate age related macular degeneration   Vessels  Normal   Periphery  Normal            IMAGING AND PROCEDURES  Imaging and Procedures for 06/07/22  OCT, Retina - OU - Both Eyes       Right Eye Quality was good. Scan locations included subfoveal. Central Foveal Thickness: 306. Progression has been stable. Findings include no IRF, no SRF, retinal drusen .   Left Eye Quality was good. Scan locations included subfoveal. Central Foveal Thickness: 274. Progression has been stable. Findings include no IRF, retinal drusen .   Notes OS, vastly improved macular findings much smaller drusen subfoveal,, with resolution of subretinal fluid today compared to July 2023     Intravitreal Injection, Pharmacologic Agent - OS - Left Eye       Time Out 06/07/2022. 4:36 PM. Confirmed correct patient, procedure, site, and patient consented.   Anesthesia Topical anesthesia was used. Anesthetic medications included Lidocaine 4%.   Procedure Preparation included 5% betadine to ocular surface, 10% betadine to eyelids, Tobramycin 0.3%. A 30 gauge needle was used.  Injection: 1.25 mg Bevacizumab 1.'25mg'$ /0.59m   Route: Intravitreal, Site: Left Eye    NDC:: 32355-732-20             ASSESSMENT/PLAN:  Exudative age-related macular degeneration of left eye with active choroidal neovascularization (HCC) OS vastly improved over time 5 weeks post most recent injection Avastin.  Repeat injection today and extend interval next to 7 weeks  Nuclear sclerotic cataract of both eyes Moderate OU  Intermediate stage nonexudative age-related macular degeneration of both eyes No sign of CNVM OD     ICD-10-CM   1. Exudative age-related macular degeneration of left eye with active choroidal neovascularization (HCC)  H35.3221 Intravitreal Injection, Pharmacologic Agent - OS - Left Eye    Bevacizumab (AVASTIN) SOLN 1.25 mg    2. Intermediate stage nonexudative age-related macular degeneration of both eyes  H35.3132 OCT, Retina - OU - Both Eyes    3. Nuclear sclerotic cataract of both eyes  H25.13       1.  OS with less subretinal fluid at 5 weeks post Avastin injection #1.  Repeat injection today and reevaluate next in 7 weeks  2.  3.  Ophthalmic Meds Ordered this visit:  Meds ordered this encounter  Medications   Bevacizumab (AVASTIN) SOLN 1.25 mg       Return in about 7 weeks (around 07/26/2022) for dilate, OS, AVASTIN OCT.  There are no Patient Instructions on file for this visit.   Explained the diagnoses, plan, and follow up with the patient and they expressed understanding.  Patient expressed understanding of the importance of proper follow up care.   GClent DemarkRankin M.D. Diseases & Surgery of the Retina and Vitreous Retina & Diabetic [place]09/07/23     Abbreviations: M myopia (nearsighted); A astigmatism; H hyperopia (farsighted); P presbyopia; Mrx spectacle prescription;  CTL contact lenses; OD right eye; OS left eye; OU both eyes  XT exotropia; ET esotropia; PEK punctate epithelial keratitis; PEE punctate epithelial erosions; DES dry eye syndrome; MGD meibomian gland dysfunction; ATs artificial tears; PFAT's  preservative free artificial tears; NTarpey Villagenuclear sclerotic cataract; PSC posterior subcapsular cataract; ERM epi-retinal membrane; PVD posterior vitreous detachment; RD retinal detachment; DM diabetes mellitus; DR diabetic retinopathy; NPDR non-proliferative diabetic retinopathy; PDR proliferative diabetic retinopathy; CSME clinically significant macular edema; DME diabetic macular edema; dbh dot blot hemorrhages; CWS cotton wool spot; POAG primary open angle glaucoma; C/D cup-to-disc ratio; HVF humphrey visual field; GVF goldmann visual field; OCT optical coherence tomography; IOP intraocular pressure; BRVO Branch retinal vein occlusion; CRVO central retinal vein occlusion; CRAO central retinal artery occlusion; BRAO branch retinal artery occlusion; RT retinal tear; SB scleral buckle; PPV pars plana vitrectomy; VH Vitreous hemorrhage; PRP panretinal laser photocoagulation; IVK intravitreal kenalog; VMT vitreomacular traction; MH Macular hole;  NVD neovascularization of the disc; NVE neovascularization elsewhere; AREDS age related eye disease study; ARMD age related macular degeneration; POAG primary open angle glaucoma; EBMD epithelial/anterior basement membrane dystrophy; ACIOL anterior chamber intraocular lens; IOL intraocular lens; PCIOL posterior chamber intraocular lens; Phaco/IOL phacoemulsification with intraocular lens placement; PThaynephotorefractive keratectomy; LASIK laser assisted in situ keratomileusis; HTN hypertension; DM diabetes mellitus; COPD chronic obstructive pulmonary disease

## 2022-06-07 NOTE — Assessment & Plan Note (Signed)
Moderate OU

## 2022-06-07 NOTE — Assessment & Plan Note (Signed)
OS vastly improved over time 5 weeks post most recent injection Avastin.  Repeat injection today and extend interval next to 7 weeks

## 2022-06-07 NOTE — Assessment & Plan Note (Signed)
No sign of CNVM OD 

## 2022-07-26 ENCOUNTER — Encounter (INDEPENDENT_AMBULATORY_CARE_PROVIDER_SITE_OTHER): Payer: Medicare PPO | Admitting: Ophthalmology

## 2022-07-26 DIAGNOSIS — H35033 Hypertensive retinopathy, bilateral: Secondary | ICD-10-CM | POA: Diagnosis not present

## 2022-07-26 DIAGNOSIS — H353112 Nonexudative age-related macular degeneration, right eye, intermediate dry stage: Secondary | ICD-10-CM | POA: Diagnosis not present

## 2022-07-26 DIAGNOSIS — H353221 Exudative age-related macular degeneration, left eye, with active choroidal neovascularization: Secondary | ICD-10-CM

## 2022-07-26 DIAGNOSIS — H43813 Vitreous degeneration, bilateral: Secondary | ICD-10-CM

## 2022-07-26 DIAGNOSIS — I10 Essential (primary) hypertension: Secondary | ICD-10-CM

## 2022-08-30 IMAGING — CT CT CHEST W/O CM
1 series · 15 of 34 positions shown, 19 images · non-contrast
Comparison: MR abdomen 06/22/2020 and CT abdomen 06/20/2020.

CLINICAL DATA: Restaging colon cancer.

EXAM:
CT CHEST WITHOUT CONTRAST
TECHNIQUE: Multidetector CT imaging of the chest was performed following the
standard protocol without IV contrast.

[Series 2: chest w/(date) · axial · 0.58mm/px · z∈[-268,-24]mm · 15 of 144 slices shown, 19 images]
[im 11/144  mediastinal]
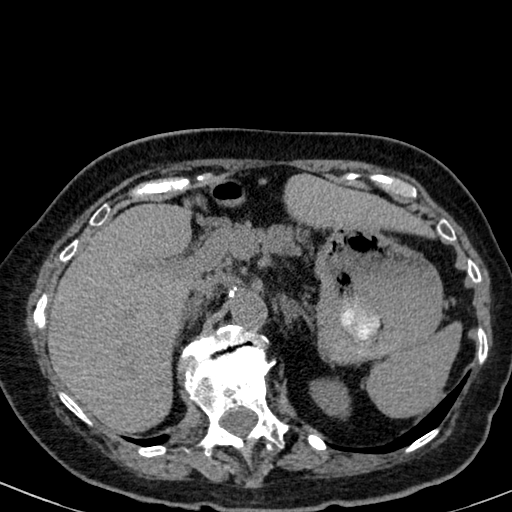
[im 11/144  lung]
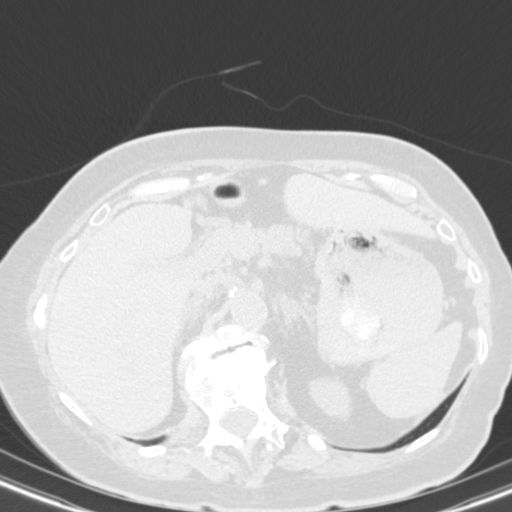
[im 22/144  lung]
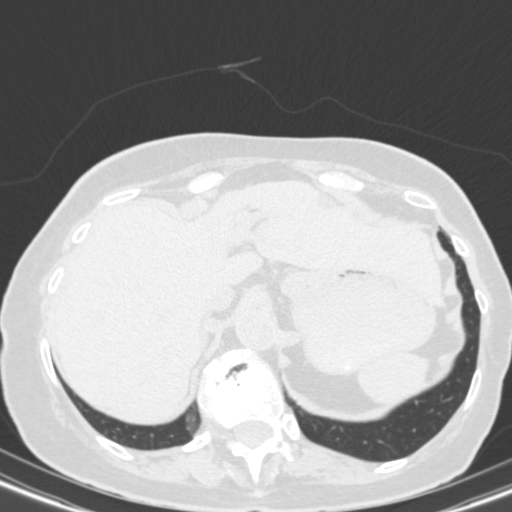
[im 29/144  lung]
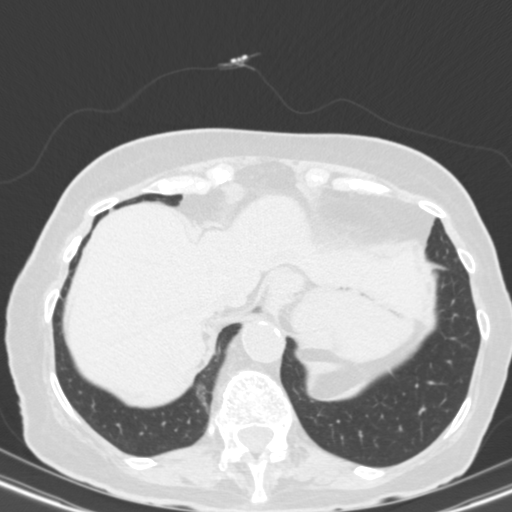
[im 38/144  lung]
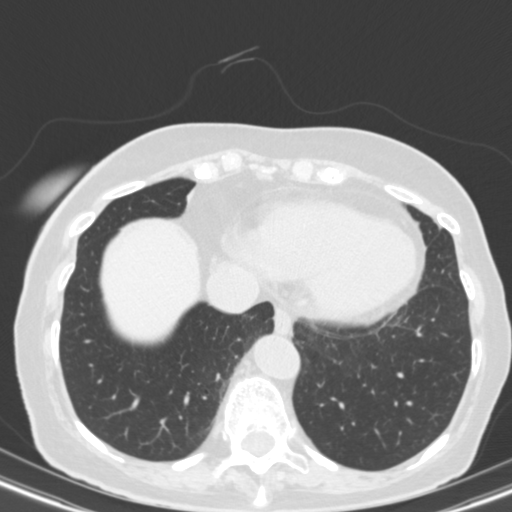
[im 48/144  mediastinal]
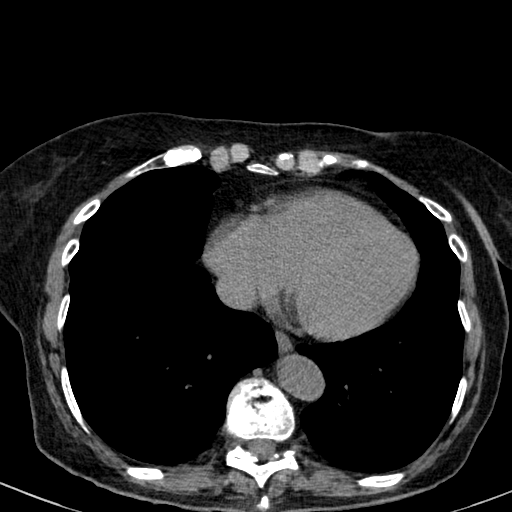
[im 48/144  lung]
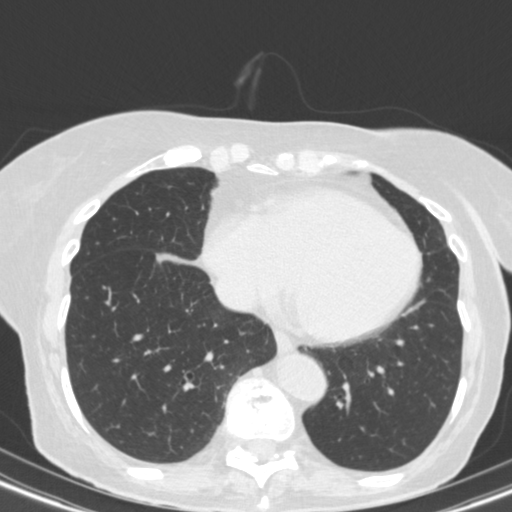
[im 58/144  lung]
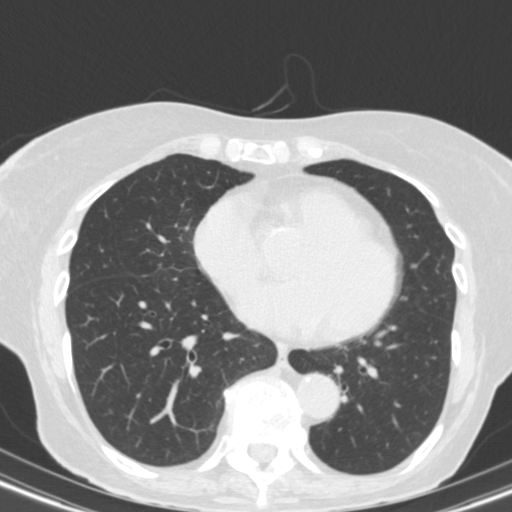
[im 64/144  lung]
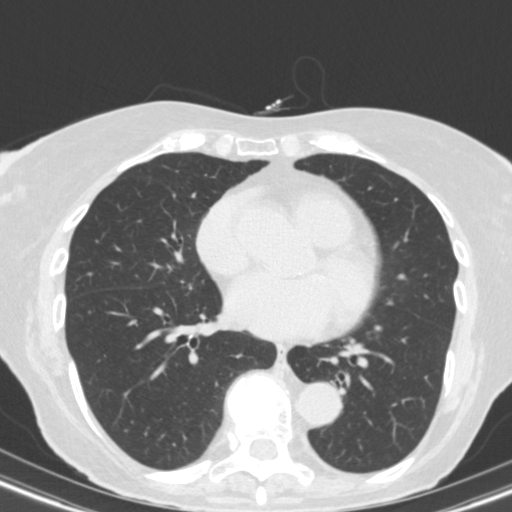
[im 75/144  lung]
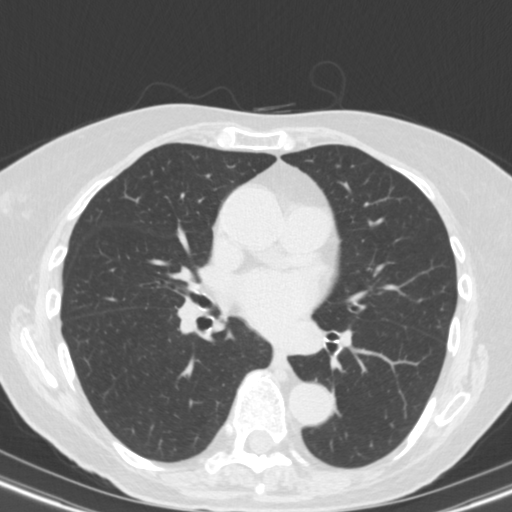
[im 80/144  mediastinal]
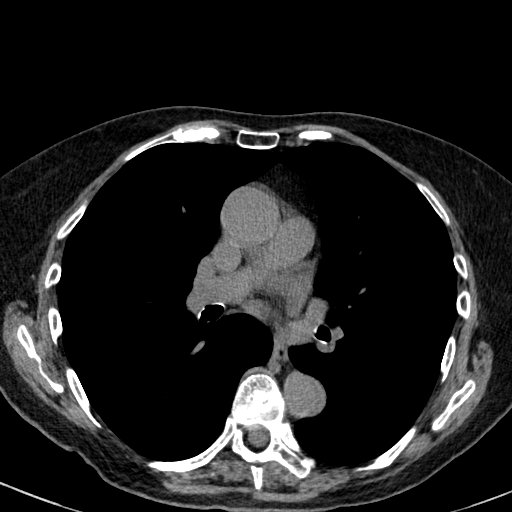
[im 80/144  lung]
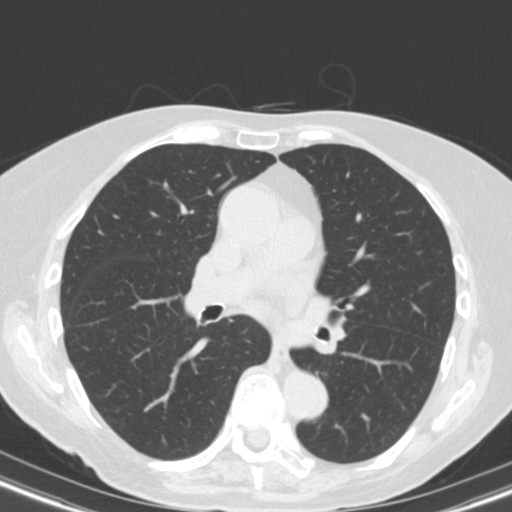
[im 86/144  lung]
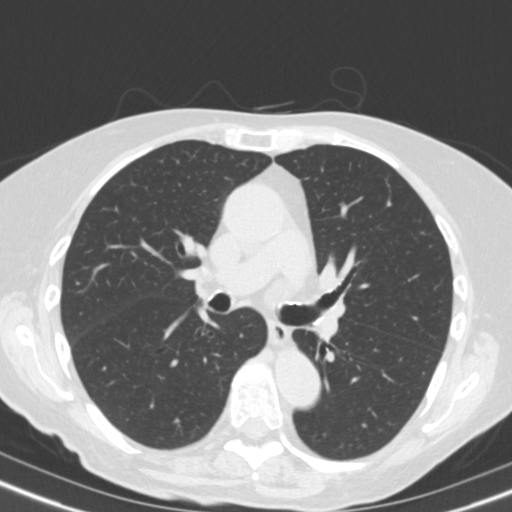
[im 96/144  lung]
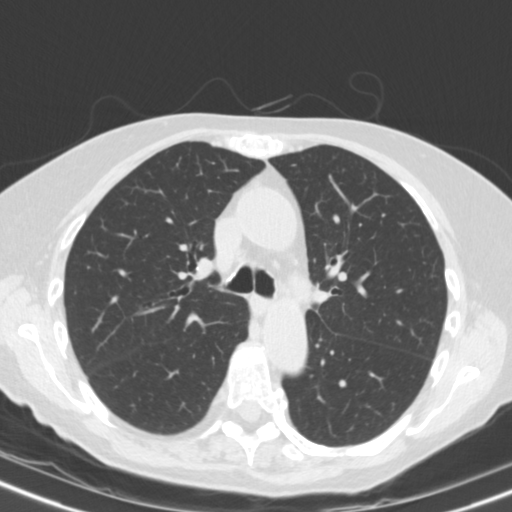
[im 106/144  lung]
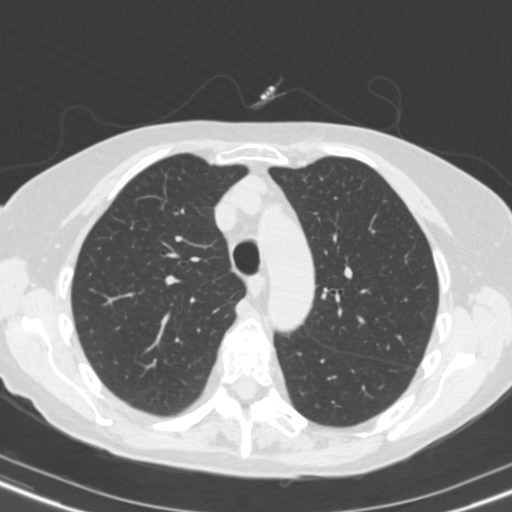
[im 115/144  mediastinal]
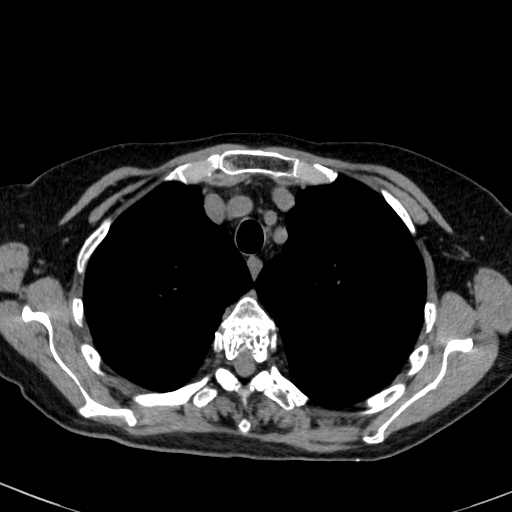
[im 115/144  lung]
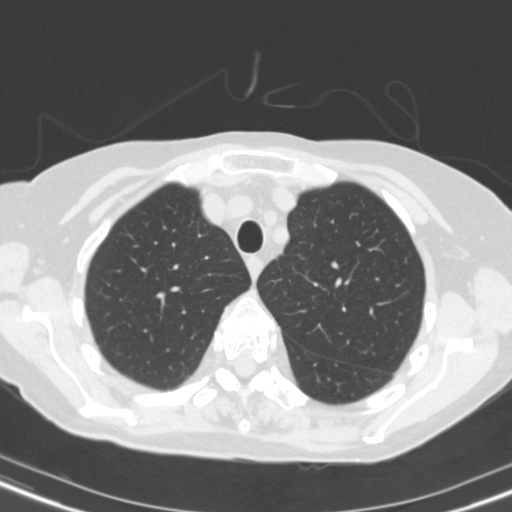
[im 122/144  lung]
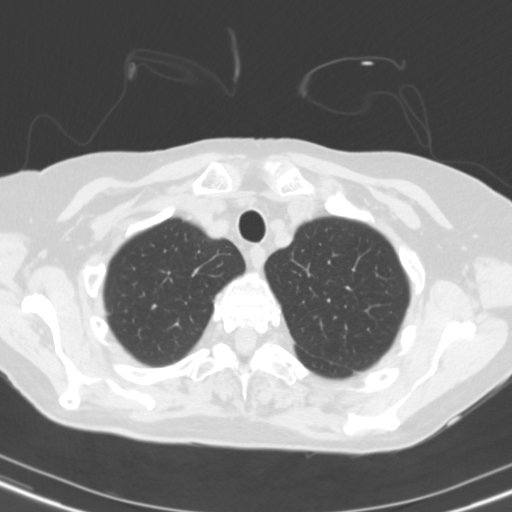
[im 133/144  lung]
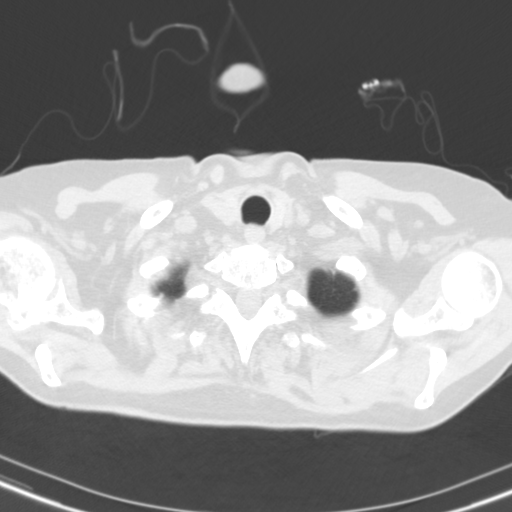

[15 of 34 positions shown; findings below may reference images not displayed]

FINDINGS: Cardiovascular: Atherosclerotic calcification of the aorta, aortic
valve and coronary arteries. Heart size is at the upper limits of
normal. No pericardial effusion.

Mediastinum/Nodes: No pathologically enlarged mediastinal or
axillary lymph nodes. Hilar regions are difficult to evaluate
without IV contrast. Esophagus is grossly unremarkable.

Lungs/Pleura: Lungs are clear. No pleural fluid. Airway is
unremarkable.

Upper Abdomen: 1.8 cm low-attenuation lesion in the left hepatic
lobe, better characterized on MR abdomen 06/22/2020. Right adrenal
gland is unremarkable. Slight nodular thickening of the left adrenal
gland. Visualized portions of the left kidney, spleen, pancreas,
stomach and bowel are grossly unremarkable. No upper abdominal
adenopathy.

Musculoskeletal: Degenerative changes in the spine and right
shoulder. No worrisome lytic or sclerotic lesions. Old right rib
fractures. T9 compression fracture, likely old. Striated lucency in
the T4 vertebral body, likely a hemangioma.
IMPRESSION: 1. No evidence of metastatic disease in the chest.
2. Aortic atherosclerosis ([id number]). Coronary artery
calcification.

## 2022-09-05 ENCOUNTER — Encounter (INDEPENDENT_AMBULATORY_CARE_PROVIDER_SITE_OTHER): Payer: Medicare PPO | Admitting: Ophthalmology

## 2022-09-05 DIAGNOSIS — H35033 Hypertensive retinopathy, bilateral: Secondary | ICD-10-CM

## 2022-09-05 DIAGNOSIS — I10 Essential (primary) hypertension: Secondary | ICD-10-CM | POA: Diagnosis not present

## 2022-09-05 DIAGNOSIS — H43813 Vitreous degeneration, bilateral: Secondary | ICD-10-CM | POA: Diagnosis not present

## 2022-09-05 DIAGNOSIS — H353112 Nonexudative age-related macular degeneration, right eye, intermediate dry stage: Secondary | ICD-10-CM | POA: Diagnosis not present

## 2022-09-05 DIAGNOSIS — H353221 Exudative age-related macular degeneration, left eye, with active choroidal neovascularization: Secondary | ICD-10-CM | POA: Diagnosis not present

## 2022-09-06 ENCOUNTER — Encounter (INDEPENDENT_AMBULATORY_CARE_PROVIDER_SITE_OTHER): Payer: Medicare PPO | Admitting: Ophthalmology

## 2022-10-24 ENCOUNTER — Encounter (INDEPENDENT_AMBULATORY_CARE_PROVIDER_SITE_OTHER): Payer: Medicare PPO | Admitting: Ophthalmology

## 2022-10-24 DIAGNOSIS — H353112 Nonexudative age-related macular degeneration, right eye, intermediate dry stage: Secondary | ICD-10-CM | POA: Diagnosis not present

## 2022-10-24 DIAGNOSIS — H353221 Exudative age-related macular degeneration, left eye, with active choroidal neovascularization: Secondary | ICD-10-CM

## 2022-10-24 DIAGNOSIS — I10 Essential (primary) hypertension: Secondary | ICD-10-CM

## 2022-10-24 DIAGNOSIS — H35033 Hypertensive retinopathy, bilateral: Secondary | ICD-10-CM

## 2022-10-24 DIAGNOSIS — H43813 Vitreous degeneration, bilateral: Secondary | ICD-10-CM

## 2022-11-15 ENCOUNTER — Telehealth: Payer: Self-pay | Admitting: *Deleted

## 2022-11-15 ENCOUNTER — Inpatient Hospital Stay: Payer: Medicare PPO | Admitting: Oncology

## 2022-11-15 ENCOUNTER — Inpatient Hospital Stay: Payer: Medicare PPO | Attending: Oncology

## 2022-11-15 VITALS — BP 131/59 | HR 75 | Temp 98.0°F | Resp 18 | Ht 63.0 in | Wt 153.6 lb

## 2022-11-15 DIAGNOSIS — Z9221 Personal history of antineoplastic chemotherapy: Secondary | ICD-10-CM | POA: Insufficient documentation

## 2022-11-15 DIAGNOSIS — Z85038 Personal history of other malignant neoplasm of large intestine: Secondary | ICD-10-CM | POA: Diagnosis present

## 2022-11-15 DIAGNOSIS — C187 Malignant neoplasm of sigmoid colon: Secondary | ICD-10-CM | POA: Diagnosis not present

## 2022-11-15 LAB — CEA (ACCESS): CEA (CHCC): 1.05 ng/mL (ref 0.00–5.00)

## 2022-11-15 NOTE — Telephone Encounter (Signed)
Informed Crystal Lewis that she will be due her last colonoscopy in August, so she will receive call for this in June or July to schedule.

## 2022-11-15 NOTE — Progress Notes (Signed)
[place] OFFICE PROGRESS NOTE   Diagnosis: Colon cancer  INTERVAL HISTORY:   Ms. Boschee returns as scheduled.  She feels well.  No difficulty with bowel function.  No bleeding.  Good appetite.  No new complaint.  Objective:  Vital signs in last 24 hours:  Blood pressure (!) 131/59, pulse 75, temperature 98 F (36.7 C), temperature source Oral, resp. rate 18, height 5' 3"$  (1.6 m), weight 153 lb 9.6 oz (69.7 kg), SpO2 99 %.    Lymphatics: No cervical, supraclavicular, axillary, or inguinal nodes Resp: Lungs clear bilaterally Cardio: Regular rate and rhythm GI: No mass, nontender, no hepatosplenomegaly Vascular: No leg edema  Skin: Mild callus formation at the soles  Lab Results:  Lab Results  Component Value Date   WBC 7.9 05/08/2022   HGB 14.6 05/08/2022   HCT 42.5 05/08/2022   MCV 91.8 05/08/2022   PLT 259 05/08/2022   NEUTROABS 5.3 05/08/2022    CMP  Lab Results  Component Value Date   NA 137 05/08/2022   K 3.6 05/08/2022   CL 96 (L) 05/08/2022   CO2 31 05/08/2022   GLUCOSE 84 05/08/2022   BUN 15 05/08/2022   CREATININE 1.03 (H) 05/08/2022   CALCIUM 9.6 05/08/2022   PROT 7.0 05/08/2022   ALBUMIN 3.9 05/08/2022   AST 18 05/08/2022   ALT 11 05/08/2022   ALKPHOS 64 05/08/2022   BILITOT 0.9 05/08/2022   GFRNONAA 55 (L) 05/08/2022    Lab Results  Component Value Date   CEA1 1.36 05/02/2021   CEA 1.05 11/15/2022     Medications: I have reviewed the patient's current medications.   Assessment/Plan: Colon cancer, sigmoid, stage IIIb (T3N1), status post a robotic sigmoid colectomy 07/12/2020         Colonoscopy 06/10/2020-sigmoid colon mass-invasive well differentiated adenocarcinoma, mismatch repair protein expression intact, incomplete colonoscopy? 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 Robotic sigmoid colectomy 07/12/2020 1/12 lymph nodes, lymphovascular invasion present Intact mismatch repair protein expression Tumor at the distal sigmoid colon proximal to the peritoneal reflection CT chest 08/03/2020-no evidence of metastatic disease Cycle 1 adjuvant Xeloda 08/15/2020 Cycle 2 adjuvant Xeloda 09/05/2020, discontinued after the morning dose on 09/15/2020 secondary to mouth soreness and hand/foot syndrome Cycle 3 adjuvant Xeloda 09/26/2020, Xeloda dose reduced to 1000 mg twice daily Cycle 4 adjuvant Xeloda 10/17/2020 (Xeloda 1000 mg twice daily) Cycle 5 adjuvant Xeloda 11/07/2020 (dose escalated to 1500 mg every morning and 1000 mg every afternoon) Cycle 6 adjuvant Xeloda 11/28/2020 (1500 mg every morning and 1000 mg every afternoon)-patient reduced dose to 1000 mg twice daily Cycle 7 adjuvant Xeloda 12/19/2020 (1500 mg every morning and 1000 mg every afternoon), 12/29/2018 2 PM dose held secondary to lip swelling/cracking, no Xeloda on 01/01/2021 secondary to erythema and pain at the soles Cycle 8 adjuvant Xeloda 01/09/2021 CTs 05/02/2021-no evidence of recurrent disease Colonoscopy 05/22/2021-negative CTs 05/08/2022-no evidence of recurrent disease Hypertension Family history of breast and colon cancer Depression Hypothyroidism Oral candidiasis following surgery October 2021 Urinary tract infection 09/08/2020 Hand/foot syndrome secondary Xeloda-Xeloda dose reduced beginning with cycle 3 History of hypokalemia secondary to diuretic therapy  Disposition: Ms. Kabala is in clinical remission from colon cancer.  The CEA is normal.  She will return for an office visit and CEA in 6 months.  She will be due for a  surveillance colonoscopy in 2025.  She will be scheduled for surveillance CTs in 6 months.  Betsy Coder, MD  11/15/2022  3:12 PM

## 2022-12-10 ENCOUNTER — Encounter (INDEPENDENT_AMBULATORY_CARE_PROVIDER_SITE_OTHER): Payer: Medicare PPO | Admitting: Ophthalmology

## 2022-12-10 DIAGNOSIS — H353221 Exudative age-related macular degeneration, left eye, with active choroidal neovascularization: Secondary | ICD-10-CM

## 2022-12-10 DIAGNOSIS — H43813 Vitreous degeneration, bilateral: Secondary | ICD-10-CM

## 2022-12-10 DIAGNOSIS — H353112 Nonexudative age-related macular degeneration, right eye, intermediate dry stage: Secondary | ICD-10-CM | POA: Diagnosis not present

## 2022-12-10 DIAGNOSIS — I10 Essential (primary) hypertension: Secondary | ICD-10-CM | POA: Diagnosis not present

## 2022-12-10 DIAGNOSIS — H35033 Hypertensive retinopathy, bilateral: Secondary | ICD-10-CM | POA: Diagnosis not present

## 2022-12-10 DIAGNOSIS — H2513 Age-related nuclear cataract, bilateral: Secondary | ICD-10-CM

## 2023-02-04 ENCOUNTER — Encounter (INDEPENDENT_AMBULATORY_CARE_PROVIDER_SITE_OTHER): Payer: Medicare PPO | Admitting: Ophthalmology

## 2023-02-04 DIAGNOSIS — H353112 Nonexudative age-related macular degeneration, right eye, intermediate dry stage: Secondary | ICD-10-CM | POA: Diagnosis not present

## 2023-02-04 DIAGNOSIS — I10 Essential (primary) hypertension: Secondary | ICD-10-CM

## 2023-02-04 DIAGNOSIS — H353221 Exudative age-related macular degeneration, left eye, with active choroidal neovascularization: Secondary | ICD-10-CM | POA: Diagnosis not present

## 2023-02-04 DIAGNOSIS — H35033 Hypertensive retinopathy, bilateral: Secondary | ICD-10-CM | POA: Diagnosis not present

## 2023-02-04 DIAGNOSIS — H43813 Vitreous degeneration, bilateral: Secondary | ICD-10-CM

## 2023-04-08 ENCOUNTER — Encounter (INDEPENDENT_AMBULATORY_CARE_PROVIDER_SITE_OTHER): Payer: Medicare PPO | Admitting: Ophthalmology

## 2023-04-08 DIAGNOSIS — H353221 Exudative age-related macular degeneration, left eye, with active choroidal neovascularization: Secondary | ICD-10-CM | POA: Diagnosis not present

## 2023-04-08 DIAGNOSIS — H353112 Nonexudative age-related macular degeneration, right eye, intermediate dry stage: Secondary | ICD-10-CM

## 2023-04-08 DIAGNOSIS — I10 Essential (primary) hypertension: Secondary | ICD-10-CM

## 2023-04-08 DIAGNOSIS — H35033 Hypertensive retinopathy, bilateral: Secondary | ICD-10-CM

## 2023-04-08 DIAGNOSIS — H43813 Vitreous degeneration, bilateral: Secondary | ICD-10-CM

## 2023-04-08 DIAGNOSIS — H2513 Age-related nuclear cataract, bilateral: Secondary | ICD-10-CM

## 2023-05-03 DIAGNOSIS — M8588 Other specified disorders of bone density and structure, other site: Secondary | ICD-10-CM | POA: Diagnosis not present

## 2023-05-03 DIAGNOSIS — Z1231 Encounter for screening mammogram for malignant neoplasm of breast: Secondary | ICD-10-CM | POA: Diagnosis not present

## 2023-05-03 DIAGNOSIS — Z85038 Personal history of other malignant neoplasm of large intestine: Secondary | ICD-10-CM | POA: Diagnosis not present

## 2023-05-03 DIAGNOSIS — Z8262 Family history of osteoporosis: Secondary | ICD-10-CM | POA: Diagnosis not present

## 2023-05-16 ENCOUNTER — Ambulatory Visit (HOSPITAL_BASED_OUTPATIENT_CLINIC_OR_DEPARTMENT_OTHER)
Admission: RE | Admit: 2023-05-16 | Discharge: 2023-05-16 | Disposition: A | Discharge status: Home / Self Care | Payer: Medicare PPO | Source: Ambulatory Visit | Attending: Oncology | Admitting: Oncology

## 2023-05-16 ENCOUNTER — Encounter (HOSPITAL_BASED_OUTPATIENT_CLINIC_OR_DEPARTMENT_OTHER): Payer: Self-pay

## 2023-05-16 ENCOUNTER — Inpatient Hospital Stay: Payer: Medicare PPO | Attending: Oncology

## 2023-05-16 DIAGNOSIS — Z08 Encounter for follow-up examination after completed treatment for malignant neoplasm: Secondary | ICD-10-CM | POA: Insufficient documentation

## 2023-05-16 DIAGNOSIS — Z85038 Personal history of other malignant neoplasm of large intestine: Secondary | ICD-10-CM | POA: Insufficient documentation

## 2023-05-16 DIAGNOSIS — K8689 Other specified diseases of pancreas: Secondary | ICD-10-CM | POA: Diagnosis not present

## 2023-05-16 DIAGNOSIS — C187 Malignant neoplasm of sigmoid colon: Secondary | ICD-10-CM | POA: Insufficient documentation

## 2023-05-16 DIAGNOSIS — I700 Atherosclerosis of aorta: Secondary | ICD-10-CM | POA: Diagnosis not present

## 2023-05-16 DIAGNOSIS — K769 Liver disease, unspecified: Secondary | ICD-10-CM | POA: Diagnosis not present

## 2023-05-16 LAB — BASIC METABOLIC PANEL - CANCER CENTER ONLY
Anion gap: 8 (ref 5–15)
BUN: 22 mg/dL (ref 8–23)
CO2: 33 mmol/L — ABNORMAL HIGH (ref 22–32)
Calcium: 9.2 mg/dL (ref 8.9–10.3)
Chloride: 102 mmol/L (ref 98–111)
Creatinine: 1.1 mg/dL — ABNORMAL HIGH (ref 0.44–1.00)
GFR, Estimated: 51 mL/min — ABNORMAL LOW (ref >60)
Glucose, Bld: 88 mg/dL (ref 70–99)
Potassium: 3.3 mmol/L — ABNORMAL LOW (ref 3.5–5.1)
Sodium: 143 mmol/L (ref 135–145)

## 2023-05-16 LAB — CEA (ACCESS): CEA (CHCC): <1 ng/mL (ref 0.00–5.00)

## 2023-05-16 MED ORDER — IOHEXOL 300 MG/ML  SOLN
100.0000 mL | Freq: Once | INTRAMUSCULAR | Status: AC | PRN
  Administered 2023-05-16: 80 mL via INTRAVENOUS

## 2023-05-20 ENCOUNTER — Inpatient Hospital Stay: Payer: Medicare PPO | Admitting: Oncology

## 2023-05-20 VITALS — BP 138/68 | HR 76 | Temp 98.1°F | Resp 18 | Ht 63.0 in | Wt 150.3 lb

## 2023-05-20 DIAGNOSIS — C187 Malignant neoplasm of sigmoid colon: Secondary | ICD-10-CM

## 2023-05-20 DIAGNOSIS — Z85038 Personal history of other malignant neoplasm of large intestine: Secondary | ICD-10-CM | POA: Diagnosis not present

## 2023-05-20 DIAGNOSIS — Z08 Encounter for follow-up examination after completed treatment for malignant neoplasm: Secondary | ICD-10-CM | POA: Diagnosis not present

## 2023-05-20 NOTE — Progress Notes (Signed)
Routed 8/15 CT report to Dr. Dorna Leitz at [facility].

## 2023-05-20 NOTE — Progress Notes (Signed)
[facility] OFFICE PROGRESS NOTE   Diagnosis: Colon cancer   INTERVAL HISTORY:   Crystal Lewis returns as scheduled.  She feels well.  Good appetite and energy level.  No difficulty with bowel function.  Objective:  Vital signs in last 24 hours:  Blood pressure 138/68, pulse 76, temperature 98.1 F (36.7 C), temperature source Oral, resp. rate 18, height 5\' 3"  (1.6 m), weight 150 lb 4.8 oz (68.2 kg), SpO2 98%.    Lymphatics: No cervical, supraclavicular, axillary, or inguinal nodes Resp: Lungs clear bilaterally Cardio: Regular rate and rhythm GI: Nontender, no mass, no hepatosplenomegaly Vascular: No leg edema, the left lower leg is slightly larger than the right side     Lab Results:  Lab Results  Component Value Date   WBC 7.9 05/08/2022   HGB 14.6 05/08/2022   HCT 42.5 05/08/2022   MCV 91.8 05/08/2022   PLT 259 05/08/2022   NEUTROABS 5.3 05/08/2022    CMP  Lab Results  Component Value Date   NA 143 05/16/2023   K 3.3 (L) 05/16/2023   CL 102 05/16/2023   CO2 33 (H) 05/16/2023   GLUCOSE 88 05/16/2023   BUN 22 05/16/2023   CREATININE 1.10 (H) 05/16/2023   CALCIUM 9.2 05/16/2023   PROT 7.0 05/08/2022   ALBUMIN 3.9 05/08/2022   AST 18 05/08/2022   ALT 11 05/08/2022   ALKPHOS 64 05/08/2022   BILITOT 0.9 05/08/2022   GFRNONAA 51 (L) 05/16/2023    Lab Results  Component Value Date   CEA1 1.36 05/02/2021   CEA <1.00 05/16/2023    Lab Results  Component Value Date   INR 1.1 07/07/2020   LABPROT 14.1 07/07/2020    Imaging:  CT CHEST ABDOMEN PELVIS W CONTRAST  Result Date: 05/16/2023 CLINICAL DATA:  History of colon cancer, annual follow-up. * Tracking Code: BO * EXAM: CT CHEST, ABDOMEN, AND PELVIS WITH CONTRAST TECHNIQUE: Multidetector CT imaging of the chest, abdomen and pelvis was performed following the standard protocol during bolus administration of intravenous contrast. RADIATION DOSE REDUCTION: This exam was performed according to  the departmental dose-optimization program which includes automated exposure control, adjustment of the mA and/or kV according to patient size and/or use of iterative reconstruction technique. CONTRAST:  80mL OMNIPAQUE IOHEXOL 300 MG/ML  SOLN COMPARISON:  Multiple priors including most recent CT May 08, 2022. FINDINGS: CT CHEST FINDINGS Cardiovascular: Normal caliber thoracic aorta no central pulmonary embolus on this nondedicated study. Normal size heart. No significant pericardial effusion/thickening. Mediastinum/Nodes: No suspicious thyroid nodule. No pathologically enlarged mediastinal, hilar or axillary lymph nodes. The esophagus is grossly unremarkable. Lungs/Pleura: No suspicious pulmonary nodules or masses. Linear band of pleuroparenchymal scarring in the right upper lobe on image 22/4. No pleural effusion. No pneumothorax Musculoskeletal: No new aggressive lytic or blastic lesion of bone. Remote compression deformity of the T9 vertebral body is unchanged. T4 vertebral body hemangioma. CT ABDOMEN PELVIS FINDINGS Hepatobiliary: Stable cysts and tiny hypodense lesions in the left lobe of the liver. No new suspicious hepatic lesion. Gallbladder is unremarkable. No biliary ductal dilation Pancreas: New hypodense cystic lesion in the pancreatic head measuring 18 x 12 mm on image 59/2. Well-circumscribed hypodense cystic lesion in the body of the pancreas measures 15 x 6 mm on image 62/2 previously 9 x 6 mm. No pancreatic ductal dilation or evidence of acute inflammation Spleen: No splenomegaly. Adrenals/Urinary Tract: Bilateral adrenal glands appear normal. No hydronephrosis. Kidneys demonstrate symmetric enhancement. Urinary bladder is unremarkable for degree of distension  Stomach/Bowel: Radiopaque enteric contrast material traverses the cecum. Stomach is unremarkable for degree of distension. No pathologic dilation of small or large bowel. Moderate volume of stool in the colon. Prior partial sigmoidectomy  with rectosigmoid anastomosis in the midline pelvis on image 96/2. No suspicious nodularity along the suture line. Vascular/Lymphatic: Aortic atherosclerosis. Normal caliber abdominal aorta. Smooth IVC contours. No pathologically enlarged abdominal or pelvic lymph nodes Reproductive: Uterus and bilateral adnexa are unremarkable. Other: No significant abdominopelvic free fluid. Pelvic floor laxity. Musculoskeletal: No nw aggressive lytic or blastic lesion of bone. Multilevel degenerative change of the spine. IMPRESSION: 1. Prior partial sigmoidectomy with rectosigmoid anastomosis in the midline pelvis. No evidence of local recurrence or metastatic disease in the chest, abdomen or pelvis. 2. New hypodense cystic lesion in the pancreatic head measuring 18 x 12 mm, and increased size of a well-circumscribed hypodense cystic lesion in the body of the pancreas measuring 15 x 6 mm previously 9 x 6 mm. 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 Recommend further evaluation with pre and postcontrast enhanced MRCP. 3.  Aortic Atherosclerosis (ICD10-I70.0). Electronically Signed   By: Maudry Mayhew M.D.   On: 05/16/2023 15:39    Medications: I have reviewed the patient's current medications.   Assessment/Plan: Colon cancer, sigmoid, stage IIIb (T3N1), status post a robotic sigmoid colectomy 07/12/2020         Colonoscopy 06/10/2020-sigmoid colon mass-invasive well differentiated adenocarcinoma, mismatch repair protein expression intact, incomplete colonoscopy? CT abdomen/pelvis 06/20/2020-well-defined liver lesions in segments 2 and 3, largest 19 mm, cystic areas/ducts in the uncinate process and head of the pancreas, distal sigmoid colon wall thickening with abnormal mild enhancement, no obstruction, 5 mm and 3 mm sigmoid mesocolon nodes MRI abdomen 06/22/2020-1.9 cm left hepatic lesion and multiple small lesions likely benign cysts, cystic lesion at the head/uncinate of the pancreas likely a sidebranch  IPMN Robotic sigmoid colectomy 07/12/2020 1/12 lymph nodes, lymphovascular invasion present Intact mismatch repair protein expression Tumor at the distal sigmoid colon proximal to the peritoneal reflection CT chest 08/03/2020-no evidence of metastatic disease Cycle 1 adjuvant Xeloda 08/15/2020 Cycle 2 adjuvant Xeloda 09/05/2020, discontinued after the morning dose on 09/15/2020 secondary to mouth soreness and hand/foot syndrome Cycle 3 adjuvant Xeloda 09/26/2020, Xeloda dose reduced to 1000 mg twice daily Cycle 4 adjuvant Xeloda 10/17/2020 (Xeloda 1000 mg twice daily) Cycle 5 adjuvant Xeloda 11/07/2020 (dose escalated to 1500 mg every morning and 1000 mg every afternoon) Cycle 6 adjuvant Xeloda 11/28/2020 (1500 mg every morning and 1000 mg every afternoon)-patient reduced dose to 1000 mg twice daily Cycle 7 adjuvant Xeloda 12/19/2020 (1500 mg every morning and 1000 mg every afternoon), 12/29/2018 2 PM dose held secondary to lip swelling/cracking, no Xeloda on 01/01/2021 secondary to erythema and pain at the soles Cycle 8 adjuvant Xeloda 01/09/2021 CTs 05/02/2021-no evidence of recurrent disease Colonoscopy 05/22/2021-negative CTs 05/08/2022-no evidence of recurrent disease CTs 05/16/2023-no evidence of recurrent disease, new hypodense cystic lesion of the pancreas head and increased size of a hypodense cystic lesion of the pancreas body Hypertension Family history of breast and colon cancer Depression Hypothyroidism Oral candidiasis following surgery October 2021 Urinary tract infection 09/08/2020 Hand/foot syndrome secondary Xeloda-Xeloda dose reduced beginning with cycle 3 History of hypokalemia secondary to diuretic therapy     Disposition: Mr. Primerano is in clinical remission from colon cancer.  She will return for an office visit and CEA in 6 months.  She will continue colonoscopy surveillance with Dr.Badreddine.  I suspect the cystic pancreas lesions are a  benign finding.  These have been evaluated with  an MRI in the past.  I will forward the CT result to Dr. Octaviano Glow Dr. Donell Beers.  Her case will be presented at the GI tumor conference within the next few weeks.   Crystal Papas, MD  05/20/2023  3:12 PM

## 2023-05-29 ENCOUNTER — Other Ambulatory Visit: Payer: Self-pay

## 2023-05-29 ENCOUNTER — Encounter: Payer: Self-pay | Admitting: *Deleted

## 2023-05-29 ENCOUNTER — Other Ambulatory Visit: Payer: Self-pay | Admitting: *Deleted

## 2023-05-29 NOTE — Progress Notes (Signed)
PATIENT NAVIGATOR PROGRESS NOTE  Name: Crystal Lewis Date: 05/29/2023 MRN: [id number]  DOB: 01-Oct-1941   Reason for visit:  F/U from GI conference  Comments:  Called patient to discuss Gi conference recommendation of MRI in 1 year to follow pancreas Pt informed our team that her GI physician at [facility] Dr Octaviano Glow had ordered an MR abdomen for 9/12.  Dr Truett Perna informed    Time spent counseling/coordinating care: 30-45 minutes

## 2023-05-29 NOTE — Progress Notes (Signed)
The proposed treatment discussed in conference is for discussion purpose only and is not a binding recommendation.  The patients have not been physically examined, or presented with their treatment options.  Therefore, final treatment plans cannot be decided.  

## 2023-06-13 DIAGNOSIS — K769 Liver disease, unspecified: Secondary | ICD-10-CM | POA: Diagnosis not present

## 2023-06-13 DIAGNOSIS — K869 Disease of pancreas, unspecified: Secondary | ICD-10-CM | POA: Diagnosis not present

## 2023-06-24 ENCOUNTER — Encounter (INDEPENDENT_AMBULATORY_CARE_PROVIDER_SITE_OTHER): Payer: Medicare PPO | Admitting: Ophthalmology

## 2023-06-25 ENCOUNTER — Encounter (INDEPENDENT_AMBULATORY_CARE_PROVIDER_SITE_OTHER): Payer: Medicare PPO | Admitting: Ophthalmology

## 2023-06-25 DIAGNOSIS — H43813 Vitreous degeneration, bilateral: Secondary | ICD-10-CM | POA: Diagnosis not present

## 2023-06-25 DIAGNOSIS — I10 Essential (primary) hypertension: Secondary | ICD-10-CM

## 2023-06-25 DIAGNOSIS — H353221 Exudative age-related macular degeneration, left eye, with active choroidal neovascularization: Secondary | ICD-10-CM | POA: Diagnosis not present

## 2023-06-25 DIAGNOSIS — H35033 Hypertensive retinopathy, bilateral: Secondary | ICD-10-CM

## 2023-06-25 DIAGNOSIS — H353112 Nonexudative age-related macular degeneration, right eye, intermediate dry stage: Secondary | ICD-10-CM | POA: Diagnosis not present

## 2023-07-02 DIAGNOSIS — R935 Abnormal findings on diagnostic imaging of other abdominal regions, including retroperitoneum: Secondary | ICD-10-CM | POA: Diagnosis not present

## 2023-07-02 DIAGNOSIS — K869 Disease of pancreas, unspecified: Secondary | ICD-10-CM | POA: Diagnosis not present

## 2023-07-23 DIAGNOSIS — Z79890 Hormone replacement therapy (postmenopausal): Secondary | ICD-10-CM | POA: Diagnosis not present

## 2023-07-23 DIAGNOSIS — Z79899 Other long term (current) drug therapy: Secondary | ICD-10-CM | POA: Diagnosis not present

## 2023-07-23 DIAGNOSIS — I781 Nevus, non-neoplastic: Secondary | ICD-10-CM | POA: Diagnosis not present

## 2023-07-23 DIAGNOSIS — K862 Cyst of pancreas: Secondary | ICD-10-CM | POA: Diagnosis not present

## 2023-07-23 DIAGNOSIS — E039 Hypothyroidism, unspecified: Secondary | ICD-10-CM | POA: Diagnosis not present

## 2023-07-23 DIAGNOSIS — K869 Disease of pancreas, unspecified: Secondary | ICD-10-CM | POA: Diagnosis not present

## 2023-07-23 DIAGNOSIS — I10 Essential (primary) hypertension: Secondary | ICD-10-CM | POA: Diagnosis not present

## 2023-07-24 ENCOUNTER — Encounter: Payer: Self-pay | Admitting: Oncology

## 2023-07-26 ENCOUNTER — Telehealth: Payer: Self-pay | Admitting: *Deleted

## 2023-07-26 NOTE — Telephone Encounter (Signed)
LV to check MyChart message regarding Dr. Kalman Drape thoughts on your labs/tests at [facility].

## 2023-09-11 ENCOUNTER — Encounter (INDEPENDENT_AMBULATORY_CARE_PROVIDER_SITE_OTHER): Payer: Medicare PPO | Admitting: Ophthalmology

## 2023-09-16 ENCOUNTER — Encounter (INDEPENDENT_AMBULATORY_CARE_PROVIDER_SITE_OTHER): Payer: Medicare PPO | Admitting: Ophthalmology

## 2023-09-16 ENCOUNTER — Telehealth: Payer: Self-pay | Admitting: *Deleted

## 2023-09-16 DIAGNOSIS — H35033 Hypertensive retinopathy, bilateral: Secondary | ICD-10-CM

## 2023-09-16 DIAGNOSIS — C187 Malignant neoplasm of sigmoid colon: Secondary | ICD-10-CM

## 2023-09-16 DIAGNOSIS — I10 Essential (primary) hypertension: Secondary | ICD-10-CM

## 2023-09-16 DIAGNOSIS — H353221 Exudative age-related macular degeneration, left eye, with active choroidal neovascularization: Secondary | ICD-10-CM

## 2023-09-16 DIAGNOSIS — H353112 Nonexudative age-related macular degeneration, right eye, intermediate dry stage: Secondary | ICD-10-CM

## 2023-09-16 DIAGNOSIS — H43813 Vitreous degeneration, bilateral: Secondary | ICD-10-CM | POA: Diagnosis not present

## 2023-09-16 NOTE — Telephone Encounter (Signed)
[name] left VM requesting CMP when she has her labs on 11/19/22.

## 2023-11-19 ENCOUNTER — Inpatient Hospital Stay: Payer: Medicare PPO | Attending: Oncology

## 2023-11-19 DIAGNOSIS — K8689 Other specified diseases of pancreas: Secondary | ICD-10-CM | POA: Diagnosis not present

## 2023-11-19 DIAGNOSIS — Z85038 Personal history of other malignant neoplasm of large intestine: Secondary | ICD-10-CM | POA: Insufficient documentation

## 2023-11-19 DIAGNOSIS — C187 Malignant neoplasm of sigmoid colon: Secondary | ICD-10-CM

## 2023-11-19 LAB — CMP (CANCER CENTER ONLY)
ALT: 13 U/L (ref 0–44)
AST: 20 U/L (ref 15–41)
Albumin: 3.6 g/dL (ref 3.5–5.0)
Alkaline Phosphatase: 76 U/L (ref 38–126)
Anion gap: 7 (ref 5–15)
BUN: 24 mg/dL — ABNORMAL HIGH (ref 8–23)
CO2: 32 mmol/L (ref 22–32)
Calcium: 8.6 mg/dL — ABNORMAL LOW (ref 8.9–10.3)
Chloride: 99 mmol/L (ref 98–111)
Creatinine: 1.12 mg/dL — ABNORMAL HIGH (ref 0.44–1.00)
GFR, Estimated: 49 mL/min — ABNORMAL LOW (ref >60)
Glucose, Bld: 91 mg/dL (ref 70–99)
Potassium: 3 mmol/L — ABNORMAL LOW (ref 3.5–5.1)
Sodium: 138 mmol/L (ref 135–145)
Total Bilirubin: 0.9 mg/dL (ref 0.0–1.2)
Total Protein: 6.9 g/dL (ref 6.5–8.1)

## 2023-11-19 LAB — CEA (ACCESS): CEA (CHCC): <1 ng/mL (ref 0.00–5.00)

## 2023-11-20 ENCOUNTER — Other Ambulatory Visit: Payer: Medicare PPO

## 2023-11-20 ENCOUNTER — Ambulatory Visit: Payer: Medicare PPO | Admitting: Oncology

## 2023-11-20 ENCOUNTER — Telehealth: Payer: Self-pay

## 2023-11-20 NOTE — Telephone Encounter (Signed)
-----   Message from Thornton Papas sent at 11/19/2023  4:22 PM EST ----- Please call patient, the potassium level is low, forward result to her primary provider, likely low secondary to diuretic therapy

## 2023-11-20 NOTE — Telephone Encounter (Signed)
Patient gave verbal understanding and had no further questions. I route the lab result to the patient pcp.

## 2023-12-03 ENCOUNTER — Inpatient Hospital Stay: Payer: Medicare PPO | Attending: Oncology | Admitting: Oncology

## 2023-12-03 VITALS — BP 116/68 | HR 76 | Temp 98.1°F | Resp 18 | Ht 63.0 in | Wt 149.6 lb

## 2023-12-03 DIAGNOSIS — Z9221 Personal history of antineoplastic chemotherapy: Secondary | ICD-10-CM | POA: Insufficient documentation

## 2023-12-03 DIAGNOSIS — Z9049 Acquired absence of other specified parts of digestive tract: Secondary | ICD-10-CM | POA: Diagnosis not present

## 2023-12-03 DIAGNOSIS — C187 Malignant neoplasm of sigmoid colon: Secondary | ICD-10-CM | POA: Diagnosis not present

## 2023-12-03 DIAGNOSIS — Z85038 Personal history of other malignant neoplasm of large intestine: Secondary | ICD-10-CM | POA: Insufficient documentation

## 2023-12-03 NOTE — Progress Notes (Signed)
 [facility] OFFICE PROGRESS NOTE   Diagnosis: Colon cancer  INTERVAL HISTORY:   Crystal Lewis returns as scheduled.  He generally feels well.  She had a syncope event approximately 3 weeks ago.  She reports the potassium was low.  She was started on a potassium supplement last week and her blood pressure medication was changed.  No difficulty with bowel function.  No bleeding.  Good appetite.  She underwent an EUS biopsy of the pancreas lesion on 07/23/2023.  The cytology was negative.  She reports she will continue follow-up with gastroenterology at [facility] with the plan for a surveillance MRI.  Objective:  Vital signs in last 24 hours:  Blood pressure 116/68, pulse 76, temperature 98.1 F (36.7 C), temperature source Temporal, resp. rate 18, height 5\' 3"  (1.6 m), weight 149 lb 9.6 oz (67.9 kg), SpO2 100%.    Lymphatics: No cervical, supraclavicular, axillary, or inguinal nodes Resp: Clear bilaterally Cardio: Regular rate and rhythm GI: No hepatomegaly, no mass, nontender Vascular: No leg edema   Lab Results:  Lab Results  Component Value Date   WBC 7.9 05/08/2022   HGB 14.6 05/08/2022   HCT 42.5 05/08/2022   MCV 91.8 05/08/2022   PLT 259 05/08/2022   NEUTROABS 5.3 05/08/2022    CMP  Lab Results  Component Value Date   NA 138 11/19/2023   K 3.0 (L) 11/19/2023   CL 99 11/19/2023   CO2 32 11/19/2023   GLUCOSE 91 11/19/2023   BUN 24 (H) 11/19/2023   CREATININE 1.12 (H) 11/19/2023   CALCIUM 8.6 (L) 11/19/2023   PROT 6.9 11/19/2023   ALBUMIN 3.6 11/19/2023   AST 20 11/19/2023   ALT 13 11/19/2023   ALKPHOS 76 11/19/2023   BILITOT 0.9 11/19/2023   GFRNONAA 49 (L) 11/19/2023    Lab Results  Component Value Date   CEA1 1.36 05/02/2021   CEA <1.00 11/19/2023    Lab Results  Component Value Date   INR 1.1 07/07/2020   LABPROT 14.1 07/07/2020     Medications: I have reviewed the patient's current medications.   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 Robotic sigmoid colectomy 07/12/2020 1/12 lymph nodes, lymphovascular invasion present Intact mismatch repair protein expression Tumor at the distal sigmoid colon proximal to the peritoneal reflection CT chest 08/03/2020-no evidence of metastatic disease Cycle 1 adjuvant Xeloda 08/15/2020 Cycle 2 adjuvant Xeloda 09/05/2020, discontinued after the morning dose on 09/15/2020 secondary to mouth soreness and hand/foot syndrome Cycle 3 adjuvant Xeloda 09/26/2020, Xeloda dose reduced to 1000 mg twice daily Cycle 4 adjuvant Xeloda 10/17/2020 (Xeloda 1000 mg twice daily) Cycle 5 adjuvant Xeloda 11/07/2020 (dose escalated to 1500 mg every morning and 1000 mg every afternoon) Cycle 6 adjuvant Xeloda 11/28/2020 (1500 mg every morning and 1000 mg every afternoon)-patient reduced dose to 1000 mg twice daily Cycle 7 adjuvant Xeloda 12/19/2020 (1500 mg every morning and 1000 mg every afternoon), 12/29/2018  2 PM dose held secondary to lip swelling/cracking, no Xeloda on 01/01/2021 secondary to erythema and pain at the soles Cycle 8 adjuvant Xeloda 01/09/2021 CTs 05/02/2021-no evidence of recurrent disease Colonoscopy 05/22/2021-negative CTs 05/08/2022-no evidence of recurrent disease CTs 05/16/2023-no evidence of recurrent disease, new hypodense  cystic lesion of the pancreas head and increased size of a hypodense cystic lesion of the pancreas body MRI/MRCP 06/13/2023-mildly complex cystic lesion in the pancreas head measuring 1.7 x 1.4 x 2.4 cm with indeterminate imaging characteristics suspicious for a large branch IPMN 07/23/2023-EUS biopsy of pancreas lesion, negative cytology Hypertension Family history of breast and colon cancer Depression Hypothyroidism Oral candidiasis following surgery October 2021 Urinary tract infection 09/08/2020 Hand/foot syndrome secondary Xeloda-Xeloda dose reduced beginning with cycle 3 History of hypokalemia secondary to diuretic therapy       Disposition: Crystal Lewis is in clinical remission from colon cancer.  She will return for an office visit and CEA in 6 months.  She is due for a surveillance colonoscopy later this year.  She will continue follow-up with Dr. Octaviano Lewis for colonoscopy surveillance and evaluation of the pancreas cystic lesion.   She is now on a potassium supplement and her diuretic has been changed.  She would like to return here for a follow-up BMP and 1 week in 3 weeks. Thornton Papas, MD  12/03/2023  12:10 PM

## 2023-12-05 ENCOUNTER — Telehealth: Payer: Self-pay | Admitting: *Deleted

## 2023-12-05 ENCOUNTER — Inpatient Hospital Stay

## 2023-12-05 DIAGNOSIS — C187 Malignant neoplasm of sigmoid colon: Secondary | ICD-10-CM

## 2023-12-05 DIAGNOSIS — Z85038 Personal history of other malignant neoplasm of large intestine: Secondary | ICD-10-CM | POA: Diagnosis not present

## 2023-12-05 LAB — BASIC METABOLIC PANEL - CANCER CENTER ONLY
Anion gap: 9 (ref 5–15)
BUN: 21 mg/dL (ref 8–23)
CO2: 30 mmol/L (ref 22–32)
Calcium: 8.9 mg/dL (ref 8.9–10.3)
Chloride: 102 mmol/L (ref 98–111)
Creatinine: 1.11 mg/dL — ABNORMAL HIGH (ref 0.44–1.00)
GFR, Estimated: 50 mL/min — ABNORMAL LOW (ref >60)
Glucose, Bld: 91 mg/dL (ref 70–99)
Potassium: 3.8 mmol/L (ref 3.5–5.1)
Sodium: 141 mmol/L (ref 135–145)

## 2023-12-05 NOTE — Telephone Encounter (Signed)
 Called Crystal Lewis w/normal K+ results and routed results to PCP.

## 2023-12-05 NOTE — Telephone Encounter (Signed)
-----   Message from Thornton Papas sent at 12/05/2023  2:05 PM EST ----- Please call patient the potassium is normal, copy to primary provider

## 2023-12-09 ENCOUNTER — Encounter: Payer: Self-pay | Admitting: Oncology

## 2023-12-09 ENCOUNTER — Encounter (INDEPENDENT_AMBULATORY_CARE_PROVIDER_SITE_OTHER): Payer: Medicare PPO | Admitting: Ophthalmology

## 2023-12-09 DIAGNOSIS — H353221 Exudative age-related macular degeneration, left eye, with active choroidal neovascularization: Secondary | ICD-10-CM

## 2023-12-09 DIAGNOSIS — H353112 Nonexudative age-related macular degeneration, right eye, intermediate dry stage: Secondary | ICD-10-CM | POA: Diagnosis not present

## 2023-12-09 DIAGNOSIS — H35033 Hypertensive retinopathy, bilateral: Secondary | ICD-10-CM | POA: Diagnosis not present

## 2023-12-09 DIAGNOSIS — I10 Essential (primary) hypertension: Secondary | ICD-10-CM | POA: Diagnosis not present

## 2023-12-09 DIAGNOSIS — H43813 Vitreous degeneration, bilateral: Secondary | ICD-10-CM

## 2023-12-09 DIAGNOSIS — H2513 Age-related nuclear cataract, bilateral: Secondary | ICD-10-CM

## 2023-12-10 ENCOUNTER — Other Ambulatory Visit: Payer: Self-pay | Admitting: *Deleted

## 2023-12-10 DIAGNOSIS — C187 Malignant neoplasm of sigmoid colon: Secondary | ICD-10-CM

## 2023-12-10 NOTE — Progress Notes (Signed)
 Patient requests CBC/diff be collected at her 3/20 lab appointment for a provider at [facility] to avoid driving to [facility]. Order placed.

## 2023-12-19 ENCOUNTER — Inpatient Hospital Stay

## 2023-12-19 DIAGNOSIS — Z85038 Personal history of other malignant neoplasm of large intestine: Secondary | ICD-10-CM | POA: Diagnosis not present

## 2023-12-19 DIAGNOSIS — C187 Malignant neoplasm of sigmoid colon: Secondary | ICD-10-CM

## 2023-12-19 LAB — CBC WITH DIFFERENTIAL (CANCER CENTER ONLY)
Abs Immature Granulocytes: 0.03 10*3/uL (ref 0.00–0.07)
Basophils Absolute: 0.1 10*3/uL (ref 0.0–0.1)
Basophils Relative: 1 %
Eosinophils Absolute: 0.2 10*3/uL (ref 0.0–0.5)
Eosinophils Relative: 2 %
HCT: 43.6 % (ref 36.0–46.0)
Hemoglobin: 14.9 g/dL (ref 12.0–15.0)
Immature Granulocytes: 0 %
Lymphocytes Relative: 26 %
Lymphs Abs: 2.3 10*3/uL (ref 0.7–4.0)
MCH: 31.9 pg (ref 26.0–34.0)
MCHC: 34.2 g/dL (ref 30.0–36.0)
MCV: 93.4 fL (ref 80.0–100.0)
Monocytes Absolute: 0.9 10*3/uL (ref 0.1–1.0)
Monocytes Relative: 10 %
Neutro Abs: 5.2 10*3/uL (ref 1.7–7.7)
Neutrophils Relative %: 61 %
Platelet Count: 270 10*3/uL (ref 150–400)
RBC: 4.67 MIL/uL (ref 3.87–5.11)
RDW: 12.8 % (ref 11.5–15.5)
WBC Count: 8.6 10*3/uL (ref 4.0–10.5)
nRBC: 0 % (ref 0.0–0.2)

## 2023-12-19 LAB — BASIC METABOLIC PANEL - CANCER CENTER ONLY
Anion gap: 8 (ref 5–15)
BUN: 18 mg/dL (ref 8–23)
CO2: 30 mmol/L (ref 22–32)
Calcium: 9 mg/dL (ref 8.9–10.3)
Chloride: 101 mmol/L (ref 98–111)
Creatinine: 1.12 mg/dL — ABNORMAL HIGH (ref 0.44–1.00)
GFR, Estimated: 49 mL/min — ABNORMAL LOW (ref >60)
Glucose, Bld: 100 mg/dL — ABNORMAL HIGH (ref 70–99)
Potassium: 3.6 mmol/L (ref 3.5–5.1)
Sodium: 139 mmol/L (ref 135–145)

## 2024-01-24 ENCOUNTER — Telehealth: Payer: Self-pay | Admitting: *Deleted

## 2024-01-24 NOTE — Telephone Encounter (Addendum)
 Crystal Lewis left message with scheduler that she needed to speak w/nurse today. This RN called back and left VM that she will be called again later today, or she is welcome to sent MyChart message. Left VM again for her to call if she still needs to speak w/a nurse.

## 2024-03-09 ENCOUNTER — Encounter (INDEPENDENT_AMBULATORY_CARE_PROVIDER_SITE_OTHER): Admitting: Ophthalmology

## 2024-03-09 DIAGNOSIS — H35033 Hypertensive retinopathy, bilateral: Secondary | ICD-10-CM

## 2024-03-09 DIAGNOSIS — H353221 Exudative age-related macular degeneration, left eye, with active choroidal neovascularization: Secondary | ICD-10-CM | POA: Diagnosis not present

## 2024-03-09 DIAGNOSIS — I10 Essential (primary) hypertension: Secondary | ICD-10-CM

## 2024-03-09 DIAGNOSIS — H43813 Vitreous degeneration, bilateral: Secondary | ICD-10-CM

## 2024-03-09 DIAGNOSIS — H353112 Nonexudative age-related macular degeneration, right eye, intermediate dry stage: Secondary | ICD-10-CM | POA: Diagnosis not present

## 2024-06-02 ENCOUNTER — Ambulatory Visit: Admitting: Oncology

## 2024-06-02 ENCOUNTER — Other Ambulatory Visit

## 2024-06-15 ENCOUNTER — Ambulatory Visit: Payer: Self-pay | Admitting: Oncology

## 2024-06-15 ENCOUNTER — Inpatient Hospital Stay: Attending: Oncology

## 2024-06-15 ENCOUNTER — Inpatient Hospital Stay: Admitting: Oncology

## 2024-06-15 VITALS — BP 108/62 | HR 78 | Temp 98.2°F | Resp 18 | Ht 63.0 in | Wt 147.3 lb

## 2024-06-15 DIAGNOSIS — C187 Malignant neoplasm of sigmoid colon: Secondary | ICD-10-CM

## 2024-06-15 DIAGNOSIS — Z08 Encounter for follow-up examination after completed treatment for malignant neoplasm: Secondary | ICD-10-CM | POA: Insufficient documentation

## 2024-06-15 DIAGNOSIS — Z85038 Personal history of other malignant neoplasm of large intestine: Secondary | ICD-10-CM | POA: Diagnosis not present

## 2024-06-15 DIAGNOSIS — Z9221 Personal history of antineoplastic chemotherapy: Secondary | ICD-10-CM | POA: Diagnosis not present

## 2024-06-15 LAB — CEA (ACCESS): CEA (CHCC): 1.24 ng/mL (ref 0.00–5.00)

## 2024-06-15 LAB — BASIC METABOLIC PANEL - CANCER CENTER ONLY
Anion gap: 11 (ref 5–15)
BUN: 15 mg/dL (ref 8–23)
CO2: 28 mmol/L (ref 22–32)
Calcium: 9.5 mg/dL (ref 8.9–10.3)
Chloride: 101 mmol/L (ref 98–111)
Creatinine: 1.13 mg/dL — ABNORMAL HIGH (ref 0.44–1.00)
GFR, Estimated: 49 mL/min — ABNORMAL LOW (ref >60)
Glucose, Bld: 95 mg/dL (ref 70–99)
Potassium: 3.9 mmol/L (ref 3.5–5.1)
Sodium: 140 mmol/L (ref 135–145)

## 2024-06-15 NOTE — Telephone Encounter (Signed)
-----   Message from Arley Hof sent at 06/15/2024  1:50 PM EDT ----- Please call patient, the CEA is normal, follow-up as scheduled  ----- Message ----- From: Rebecka, Lab In [place] Sent: 06/15/2024  12:04 PM EDT To: Arley KATHEE Hof, MD

## 2024-06-15 NOTE — Progress Notes (Signed)
 [facility] OFFICE PROGRESS NOTE   Diagnosis: Colon cancer  INTERVAL HISTORY:   Crystal Lewis returns as scheduled.  She feels well.  No difficulty with bowel function.  No bleeding.  Good appetite.  No complaint.  She is on the wait list for a colonoscopy with Dr. Murriel.  Objective:  Vital signs in last 24 hours:  Blood pressure 108/62, pulse 78, temperature 98.2 F (36.8 C), temperature source Temporal, resp. rate 18, height 5' 3 (1.6 m), weight 147 lb 4.8 oz (66.8 kg), SpO2 100%.     Lymphatics: No cervical, supraclavicular, axillary, or inguinal nodes Resp: Lungs clear bilaterally Cardio: Regular rate and rhythm GI: No hepatosplenomegaly, nontender, no mass Vascular: No leg edema   Lab Results:  Lab Results  Component Value Date   WBC 8.6 12/19/2023   HGB 14.9 12/19/2023   HCT 43.6 12/19/2023   MCV 93.4 12/19/2023   PLT 270 12/19/2023   NEUTROABS 5.2 12/19/2023    CMP  Lab Results  Component Value Date   NA 139 12/19/2023   K 3.6 12/19/2023   CL 101 12/19/2023   CO2 30 12/19/2023   GLUCOSE 100 (H) 12/19/2023   BUN 18 12/19/2023   CREATININE 1.12 (H) 12/19/2023   CALCIUM 9.0 12/19/2023   PROT 6.9 11/19/2023   ALBUMIN 3.6 11/19/2023   AST 20 11/19/2023   ALT 13 11/19/2023   ALKPHOS 76 11/19/2023   BILITOT 0.9 11/19/2023   GFRNONAA 49 (L) 12/19/2023    Lab Results  Component Value Date   CEA1 1.36 05/02/2021   CEA <1.00 11/19/2023     Medications: I have reviewed the patient's current medications.   Assessment/Plan:  Colon cancer, sigmoid, stage IIIb (T3N1), status post a robotic sigmoid colectomy 07/12/2020         Colonoscopy 06/10/2020-sigmoid colon mass-invasive well differentiated adenocarcinoma, mismatch repair protein expression intact, incomplete colonoscopy? 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 Robotic sigmoid colectomy 07/12/2020 1/12 lymph nodes, lymphovascular invasion present Intact mismatch repair protein expression Tumor at the distal sigmoid colon proximal to the peritoneal reflection CT chest 08/03/2020-no evidence of metastatic disease Cycle 1 adjuvant Xeloda  08/15/2020 Cycle 2 adjuvant Xeloda  09/05/2020, discontinued after the morning dose on 09/15/2020 secondary to mouth soreness and hand/foot syndrome Cycle 3 adjuvant Xeloda  09/26/2020, Xeloda  dose reduced to 1000 mg twice daily Cycle 4 adjuvant Xeloda  10/17/2020 (Xeloda  1000 mg twice daily) Cycle 5 adjuvant Xeloda  11/07/2020 (dose escalated to 1500 mg every morning and 1000 mg every afternoon) Cycle 6 adjuvant Xeloda  11/28/2020 (1500 mg every morning and 1000 mg every afternoon)-patient reduced dose to 1000 mg twice daily Cycle 7 adjuvant Xeloda  12/19/2020 (1500 mg every morning and 1000 mg every afternoon), 12/29/2018 2 PM dose held secondary to lip swelling/cracking, no Xeloda  on 01/01/2021 secondary to erythema and pain at the soles Cycle 8 adjuvant Xeloda  01/09/2021 CTs 05/02/2021-no evidence of recurrent disease Colonoscopy 05/22/2021-negative CTs 05/08/2022-no evidence of recurrent disease CTs 05/16/2023-no evidence of recurrent disease, new hypodense cystic lesion of the pancreas head and increased size of a hypodense cystic lesion of the pancreas body MRI/MRCP 06/13/2023-mildly complex cystic lesion in the  pancreas head measuring 1.7 x 1.4 x 2.4 cm with indeterminate imaging characteristics suspicious for a large branch IPMN 07/23/2023-EUS biopsy of pancreas lesion, negative cytology Hypertension Family history of breast and colon  cancer Depression Hypothyroidism Oral candidiasis following surgery October 2021 Urinary tract infection 09/08/2020 Hand/foot syndrome secondary Xeloda -Xeloda  dose reduced beginning with cycle 3 History of hypokalemia secondary to diuretic therapy       Disposition: Ms. Seybold remains in clinical remission from colon cancer.  We will follow-up on the CEA from today.  She is now 4 years out from diagnosis.  She is being scheduled for a surveillance colonoscopy.  She will return for an office visit and CEA in 1 year.   Arley Hof, MD  06/15/2024  11:57 AM

## 2024-06-15 NOTE — Telephone Encounter (Signed)
 Patient gave verbal understanding and had no further questions or concerns

## 2024-06-29 ENCOUNTER — Encounter (INDEPENDENT_AMBULATORY_CARE_PROVIDER_SITE_OTHER): Admitting: Ophthalmology

## 2024-07-01 ENCOUNTER — Encounter (INDEPENDENT_AMBULATORY_CARE_PROVIDER_SITE_OTHER): Admitting: Ophthalmology

## 2024-07-01 DIAGNOSIS — H35033 Hypertensive retinopathy, bilateral: Secondary | ICD-10-CM | POA: Diagnosis not present

## 2024-07-01 DIAGNOSIS — H353112 Nonexudative age-related macular degeneration, right eye, intermediate dry stage: Secondary | ICD-10-CM

## 2024-07-01 DIAGNOSIS — I10 Essential (primary) hypertension: Secondary | ICD-10-CM | POA: Diagnosis not present

## 2024-07-01 DIAGNOSIS — H353221 Exudative age-related macular degeneration, left eye, with active choroidal neovascularization: Secondary | ICD-10-CM | POA: Diagnosis not present

## 2024-07-01 DIAGNOSIS — H43813 Vitreous degeneration, bilateral: Secondary | ICD-10-CM

## 2024-10-20 ENCOUNTER — Encounter (INDEPENDENT_AMBULATORY_CARE_PROVIDER_SITE_OTHER): Admitting: Ophthalmology

## 2024-10-20 DIAGNOSIS — H43813 Vitreous degeneration, bilateral: Secondary | ICD-10-CM | POA: Diagnosis not present

## 2024-10-20 DIAGNOSIS — H353221 Exudative age-related macular degeneration, left eye, with active choroidal neovascularization: Secondary | ICD-10-CM | POA: Diagnosis not present

## 2024-10-20 DIAGNOSIS — I10 Essential (primary) hypertension: Secondary | ICD-10-CM

## 2024-10-20 DIAGNOSIS — H35033 Hypertensive retinopathy, bilateral: Secondary | ICD-10-CM

## 2024-10-20 DIAGNOSIS — H353112 Nonexudative age-related macular degeneration, right eye, intermediate dry stage: Secondary | ICD-10-CM | POA: Diagnosis not present

## 2025-02-08 ENCOUNTER — Encounter (INDEPENDENT_AMBULATORY_CARE_PROVIDER_SITE_OTHER): Admitting: Ophthalmology

## 2025-02-15 ENCOUNTER — Encounter (INDEPENDENT_AMBULATORY_CARE_PROVIDER_SITE_OTHER): Admitting: Ophthalmology

## 2025-06-15 ENCOUNTER — Other Ambulatory Visit

## 2025-06-15 ENCOUNTER — Ambulatory Visit: Admitting: Oncology

## 2025-06-17 ENCOUNTER — Other Ambulatory Visit

## 2025-06-17 ENCOUNTER — Ambulatory Visit: Admitting: Oncology
